# Patient Record
Sex: Female | Born: 1995 | Race: Black or African American | Hispanic: No | Marital: Single | Smoking: Never smoker
Health system: Southern US, Community
[De-identification: ages and names within clinical notes are randomized; demographics above are authoritative.]

## PROBLEM LIST (undated history)

## (undated) DIAGNOSIS — E611 Iron deficiency: Secondary | ICD-10-CM

## (undated) DIAGNOSIS — Z789 Other specified health status: Secondary | ICD-10-CM

## (undated) HISTORY — DX: Iron deficiency: E61.1

## (undated) HISTORY — PX: WISDOM TOOTH EXTRACTION: SHX21

---
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
## 2016-06-08 ENCOUNTER — Emergency Department (HOSPITAL_COMMUNITY)
Admission: EM | Admit: 2016-06-08 | Discharge: 2016-06-08 | Disposition: A | Discharge status: Home / Self Care | Payer: Self-pay | Attending: Emergency Medicine | Admitting: Emergency Medicine

## 2016-06-08 ENCOUNTER — Encounter (HOSPITAL_COMMUNITY): Payer: Self-pay | Admitting: Emergency Medicine

## 2016-06-08 DIAGNOSIS — L0231 Cutaneous abscess of buttock: Secondary | ICD-10-CM | POA: Insufficient documentation

## 2016-06-08 DIAGNOSIS — Z79899 Other long term (current) drug therapy: Secondary | ICD-10-CM | POA: Insufficient documentation

## 2016-06-08 LAB — POC URINE PREG, ED: Preg Test, Ur: NEGATIVE

## 2016-06-08 MED ORDER — SULFAMETHOXAZOLE-TRIMETHOPRIM 800-160 MG PO TABS: 1.0000 | ORAL_TABLET | Freq: Two times a day (BID) | ORAL | 0 refills | Status: AC

## 2016-06-08 MED ORDER — LIDOCAINE-EPINEPHRINE (PF) 2 %-1:200000 IJ SOLN
10.0000 mL | Freq: Once | INTRAMUSCULAR | Status: AC
  Administered 2016-06-08: 10 mL
  Filled 2016-06-08: qty 20

## 2016-06-08 MED ORDER — IBUPROFEN 800 MG PO TABS: 800.0000 mg | ORAL_TABLET | Freq: Three times a day (TID) | ORAL | 0 refills | Status: DC

## 2016-06-08 MED ORDER — CEPHALEXIN 500 MG PO CAPS
500.0000 mg | ORAL_CAPSULE | Freq: Four times a day (QID) | ORAL | 0 refills | Status: DC
Start: 2016-06-08 — End: 2019-02-15

## 2016-06-08 NOTE — ED Triage Notes (Signed)
Per pt, states boil on right buttock-noticed 2 days ago-states she has been applying warm compresses but no relief

## 2016-06-08 NOTE — Discharge Instructions (Signed)
Medications: Bactrim, Keflex  Treatment: Take Bactrim and Keflex until completed. Take ibuprofen every 8 hours as needed for pain. You can alternate with Tylenol as prescribed over-the-counter. Wash wound with warm soapy water tomorrow. Continue this daily and apply clean gauze dressing.  Follow-up: Please return in 2 days for wound recheck. Please return sooner if you develop and new or worsening symptoms including fever, increased pain, redness, swelling, or streaking from the area.

## 2016-06-08 NOTE — ED Provider Notes (Signed)
[facility]-EMERGENCY DEPT Provider Note   CSN: [id number] Arrival date & time: 06/08/16  1711  By signing my name below, I, Modena Jansky, attest that this documentation has been prepared under the direction and in the presence of non-physician practitioner, Glenford Bayley, PA-C. Electronically Signed: Modena Jansky, Scribe. 06/08/2016. 7:17 PM.  History   Chief Complaint Chief Complaint  Patient presents with  . Abscess   The history is provided by the patient. No language interpreter was used.   HPI Comments: Kathryn Brandt is a 21 y.o. female who presents to the Emergency Department complaining of constant moderate right gluteal bump that started about 2 days ago. She noticed a bump near her genital area without change since initial onset. She has been applying a warm compress without relief. No medication PTA. She denies any drainage or vaginal discharge/bleeding. Patient states she shaved the area a few days prior with a eyebrow tremor.  History reviewed. No pertinent past medical history.  There are no active problems to display for this patient.   History reviewed. No pertinent surgical history.  OB History    No data available       Home Medications    Prior to Admission medications   Medication Sig Start Date End Date Taking? Authorizing Provider  cephALEXin (KEFLEX) 500 MG capsule Take 1 capsule (500 mg total) by mouth 4 (four) times daily. 06/08/16   Emi Holes, PA-C  ibuprofen (ADVIL,MOTRIN) 800 MG tablet Take 1 tablet (800 mg total) by mouth 3 (three) times daily. 06/08/16   Emi Holes, PA-C  sulfamethoxazole-trimethoprim (BACTRIM DS,SEPTRA DS) 800-160 MG tablet Take 1 tablet by mouth 2 (two) times daily. 06/08/16 06/15/16  Emi Holes, PA-C    Family History No family history on file.  Social History Social History  Substance Use Topics  . Smoking status: Never Smoker  . Smokeless tobacco: Never Used  . Alcohol use No     Allergies   Patient has no  allergy information on record.   Review of Systems Review of Systems  Constitutional: Negative for chills and fever.  HENT: Negative for facial swelling and sore throat.   Respiratory: Negative for shortness of breath.   Cardiovascular: Negative for chest pain.  Gastrointestinal: Negative for abdominal pain, nausea and vomiting.  Genitourinary: Negative for dysuria, vaginal bleeding and vaginal discharge.  Musculoskeletal: Negative for back pain.  Skin: Negative for rash and wound.       +bump (right buttock)  Neurological: Negative for headaches.  Psychiatric/Behavioral: The patient is not nervous/anxious.      Physical Exam Updated Vital Signs BP 133/69 (BP Location: Left Arm)   Pulse 95   Temp 99 F (37.2 C) (Oral)   Resp 16   Ht 5\' 4"  (1.626 m)   Wt 183 lb 4.8 oz (83.1 kg)   LMP 05/18/2016   SpO2 98%   BMI 31.46 kg/m   Physical Exam  Constitutional: She appears well-developed and well-nourished. No distress.  HENT:  Head: Normocephalic and atraumatic.  Mouth/Throat: Oropharynx is clear and moist. No oropharyngeal exudate.  Eyes: Conjunctivae are normal. Pupils are equal, round, and reactive to light. Right eye exhibits no discharge. Left eye exhibits no discharge. No scleral icterus.  Neck: Normal range of motion. Neck supple. No thyromegaly present.  Cardiovascular: Normal rate, regular rhythm, normal heart sounds and intact distal pulses.  Exam reveals no gallop and no friction rub.   No murmur heard. Pulmonary/Chest: Effort normal and breath sounds normal. No  stridor. No respiratory distress. She has no wheezes. She has no rales.  Abdominal: Soft. Bowel sounds are normal. She exhibits no distension. There is no tenderness. There is no rebound and no guarding.  Genitourinary:     Musculoskeletal: She exhibits no edema.  Lymphadenopathy:    She has no cervical adenopathy.  Neurological: She is alert. Coordination normal.  Skin: Skin is warm and dry. No rash  noted. She is not diaphoretic. No pallor.  Psychiatric: She has a normal mood and affect.  Nursing note and vitals reviewed.    ED Treatments / Results  DIAGNOSTIC STUDIES: Oxygen Saturation is 98% on RA, normal by my interpretation.    COORDINATION OF CARE: 7:21 PM- Pt advised of plan for treatment and pt agrees.  Labs (all labs ordered are listed, but only abnormal results are displayed) Labs Reviewed  POC URINE PREG, ED    EKG  EKG Interpretation None       Radiology No results found.  Procedures Procedures (including critical care time)  EMERGENCY DEPARTMENT US SOFT TISSUE INTERPRETATION "Study: Limited Soft Tissue Ultrasound"  INDICATIONS: Soft tissue infection Multiple views of the body part were obtained in real-time with a multi-frequency linear probe  PERFORMED BY: Myself IMAGES ARCHIVED?: Yes SIDE:Right  BODY PART:R gluteal region INTERPRETATION:  Abcess present and Cellulitis present  INCISION AND DRAINAGE Performed by: Emi HolesAlexandra M Dimitra Woodstock Consent: Verbal consent obtained. Risks and benefits: risks, benefits and alternatives were discussed Type: abscess  Body area: R gluteal  Anesthesia: local infiltration  Incision was made with a scalpel.  Local anesthetic: lidocaine 2% w/ epinephrine  Anesthetic total: 5 ml  Complexity: complex Blunt dissection to break up loculations  Drainage: purulent  Drainage amount: moderate  Packing material: 1/4 in iodoform gauze  Patient tolerance: Patient tolerated the procedure well with no immediate complications.     Medications Ordered in ED Medications  lidocaine-EPINEPHrine (XYLOCAINE W/EPI) 2 %-1:200000 (PF) injection 10 mL (10 mLs Infiltration Given by Other 06/08/16 2100)     Initial Impression / Assessment and Plan / ED Course  I have reviewed the triage vital signs and the nursing notes.  Pertinent labs & imaging results that were available during my care of the patient were reviewed by me  and considered in my medical decision making (see chart for details).     Patient with skin abscess. Incision and drainage performed in the ED today. Wound packed. Wound recheck in 2 days. Supportive care and return precautions discussed.  Pt sent home with Bactrim, Keflex, ibuprofen. The patient appears reasonably screened and/or stabilized for discharge and I doubt any other emergent medical condition requiring further screening, evaluation, or treatment in the ED prior to discharge.    Final Clinical Impressions(s) / ED Diagnoses   Final diagnoses:  Abscess of buttock, right    New Prescriptions Discharge Medication List as of 06/08/2016 10:03 PM    START taking these medications   Details  cephALEXin (KEFLEX) 500 MG capsule Take 1 capsule (500 mg total) by mouth 4 (four) times daily., Starting Thu 06/08/2016, Print    ibuprofen (ADVIL,MOTRIN) 800 MG tablet Take 1 tablet (800 mg total) by mouth 3 (three) times daily., Starting Thu 06/08/2016, Print    sulfamethoxazole-trimethoprim (BACTRIM DS,SEPTRA DS) 800-160 MG tablet Take 1 tablet by mouth 2 (two) times daily., Starting Thu 06/08/2016, Until Thu 06/15/2016, Print       I personally performed the services described in this documentation, which was scribed in my presence. The recorded  information has been reviewed and is accurate.     Emi Holes, PA-C 06/12/16 0112    Doug Sou, MD 06/12/16 1216

## 2016-06-11 ENCOUNTER — Emergency Department (HOSPITAL_COMMUNITY)
Admission: EM | Admit: 2016-06-11 | Discharge: 2016-06-11 | Disposition: A | Discharge status: Home / Self Care | Payer: Self-pay | Attending: Emergency Medicine | Admitting: Emergency Medicine

## 2016-06-11 ENCOUNTER — Encounter (HOSPITAL_COMMUNITY): Payer: Self-pay | Admitting: Emergency Medicine

## 2016-06-11 DIAGNOSIS — L0291 Cutaneous abscess, unspecified: Secondary | ICD-10-CM

## 2016-06-11 DIAGNOSIS — L0231 Cutaneous abscess of buttock: Secondary | ICD-10-CM | POA: Insufficient documentation

## 2016-06-11 DIAGNOSIS — Z79899 Other long term (current) drug therapy: Secondary | ICD-10-CM | POA: Insufficient documentation

## 2016-06-11 NOTE — ED Notes (Signed)
Bed: WTR6 Expected date:  Expected time:  Means of arrival:  Comments: 

## 2016-06-11 NOTE — ED Provider Notes (Signed)
[facility]-EMERGENCY DEPT Provider Note   CSN: [id number] Arrival date & time: 06/11/16  [id number]  By signing my name below, I, Kathryn Brandt, attest that this documentation has been prepared under the direction and in the presence of Newell RubbermaidJeffrey Klara Stjames, PA-C.  Electronically Signed: Octavia HeirArianna Brandt, ED Scribe. 06/11/16. 11:34 AM.    History   Chief Complaint Chief Complaint  Patient presents with  . Abscess    Recheck   The history is provided by the patient. No language interpreter was used.   HPI Comments: Kathryn Brandt is a 21 y.o. female who presents to the Emergency Department presenting for a wound check. Pt had an I&D performed on an abscess to her right buttocks on 06/08/16. She was prescribed bactrim, keflex, and ibuprofen, which she has been compliant with taking. She notes the area "still feels tight' but the pain is gradually improving. She denies fever, discharge in stool, warmth or erythema to the area.   History reviewed. No pertinent past medical history.  There are no active problems to display for this patient.   History reviewed. No pertinent surgical history.  OB History    No data available       Home Medications    Prior to Admission medications   Medication Sig Start Date End Date Taking? Authorizing Provider  cephALEXin (KEFLEX) 500 MG capsule Take 1 capsule (500 mg total) by mouth 4 (four) times daily. 06/08/16   Emi HolesAlexandra M Law, PA-C  ibuprofen (ADVIL,MOTRIN) 800 MG tablet Take 1 tablet (800 mg total) by mouth 3 (three) times daily. 06/08/16   Emi HolesAlexandra M Law, PA-C  sulfamethoxazole-trimethoprim (BACTRIM DS,SEPTRA DS) 800-160 MG tablet Take 1 tablet by mouth 2 (two) times daily. 06/08/16 06/15/16  Emi HolesAlexandra M Law, PA-C    Family History No family history on file.  Social History Social History  Substance Use Topics  . Smoking status: Never Smoker  . Smokeless tobacco: Never Used  . Alcohol use No     Allergies   Patient has no allergy information on  record.   Review of Systems Review of Systems  A complete 10 system review of systems was obtained and all systems are negative except as noted in the HPI and PMH.   Physical Exam Updated Vital Signs BP (!) 119/52 (BP Location: Left Arm)   Pulse 69   Temp 98.7 F (37.1 C) (Oral)   Resp 18   Ht 5\' 4"  (1.626 m)   Wt 83 kg   LMP 05/18/2016   SpO2 98%   BMI 31.41 kg/m   Physical Exam  Constitutional: She is oriented to person, place, and time. She appears well-developed and well-nourished.  HENT:  Head: Normocephalic.  Eyes: EOM are normal.  Neck: Normal range of motion.  Pulmonary/Chest: Effort normal.  Abdominal: She exhibits no distension.  Musculoskeletal: Normal range of motion.  incision over right buttocks healing well, packing in place, no signs of significant discharge, no surrounding tenderness, no discharge noted.  Neurological: She is alert and oriented to person, place, and time.  Psychiatric: She has a normal mood and affect.  Nursing note and vitals reviewed.    ED Treatments / Results  DIAGNOSTIC STUDIES: Oxygen Saturation is 99% on RA, normal by my interpretation.  COORDINATION OF CARE:  10:20 AM Discussed treatment plan  with pt at bedside and pt agreed to plan.  Labs (all labs ordered are listed, but only abnormal results are displayed) Labs Reviewed - No data to display  EKG  EKG  Interpretation None       Radiology No results found.  Procedures Procedures (including critical care time)  Medications Ordered in ED Medications - No data to display   Initial Impression / Assessment and Plan / ED Course  I have reviewed the triage vital signs and the nursing notes.  Pertinent labs & imaging results that were available during my care of the patient were reviewed by me and considered in my medical decision making (see chart for details).      Final Clinical Impressions(s) / ED Diagnoses   Final diagnoses:  Abscess    Labs:  Imaging:  Consults:  Therapeutics:  Discharge Meds:   Assessment/Plan:Patient presents for wound recheck. Healing well with no signs of complication. Continue wound care instructions given, return precautions given.    I personally performed the services described in this documentation, which was scribed in my presence. The recorded information has been reviewed and is accurate.  New Prescriptions Discharge Medication List as of 06/11/2016 10:44 AM       Eyvonne Mechanic, PA-C 06/11/16 1134    Maia Plan, MD 06/11/16 [phone]

## 2016-06-11 NOTE — ED Triage Notes (Signed)
Patient reports abscess to buttock area. Patient had the area drained and was told to come for a re-check. Patient has no new complaints at this time.

## 2016-06-11 NOTE — Discharge Instructions (Signed)
Please read attached information. If you experience any new or worsening signs or symptoms please return to the emergency room for evaluation. Please follow-up with your primary care provider or specialist as discussed. Please use medication prescribed only as directed and discontinue taking if you have any concerning signs or symptoms.   °

## 2018-05-01 NOTE — L&D Delivery Note (Signed)
Delivery Note Patient pushed for approximately 45 minutes after she was noted to be C/C/+2.  At 6:17 PM a viable and healthy female was delivered via Vaginal, Spontaneous (Presentation:LOA restituted to ROA).  APGAR: 8, 9; weight pending.  Shoulders and body easily delivered. Infant laid on maternal abdomen.  Delayed cord clamping done and cord cut by Grandmother.  Cord blood obtained.  Placenta spontaneously delivered intact, 3 vessels noted.   Uterine atony alleviated by massage and IV pitocin.  Second degree vaginal /perineal laceration repaired in routine fashion with 2-0 vicryl and 3-0 chromic.  Hemostasis good.  Patient tolerated delivery well. There were no complications.    Anesthesia:  Epidural Episiotomy: None Lacerations: 2nd degree;Vaginal Suture Repair: 2.0 vicryl & 3-0 Chromic Est. Blood Loss (mL):  200  Mom to postpartum.  Baby to Couplet care / Skin to Skin.  Jaylenn Altier, [place] 02/13/2019, 6:41 PM

## 2018-10-01 DIAGNOSIS — Z363 Encounter for antenatal screening for malformations: Secondary | ICD-10-CM | POA: Diagnosis not present

## 2018-10-01 DIAGNOSIS — Z3A20 20 weeks gestation of pregnancy: Secondary | ICD-10-CM | POA: Diagnosis not present

## 2019-01-29 DIAGNOSIS — Z3A37 37 weeks gestation of pregnancy: Secondary | ICD-10-CM | POA: Diagnosis not present

## 2019-01-29 DIAGNOSIS — O358XX9 Maternal care for other (suspected) fetal abnormality and damage, other fetus: Secondary | ICD-10-CM | POA: Diagnosis not present

## 2019-02-13 ENCOUNTER — Inpatient Hospital Stay (HOSPITAL_COMMUNITY): Payer: Commercial Managed Care - PPO | Admitting: Anesthesiology

## 2019-02-13 ENCOUNTER — Other Ambulatory Visit: Payer: Self-pay

## 2019-02-13 ENCOUNTER — Encounter (HOSPITAL_COMMUNITY): Payer: Self-pay | Admitting: *Deleted

## 2019-02-13 ENCOUNTER — Inpatient Hospital Stay (HOSPITAL_COMMUNITY)
Admission: AD | Admit: 2019-02-13 | Discharge: 2019-02-15 | DRG: 807 | Disposition: A | Discharge status: Home / Self Care | Payer: Commercial Managed Care - PPO | Attending: Obstetrics & Gynecology | Admitting: Obstetrics & Gynecology

## 2019-02-13 DIAGNOSIS — Z20828 Contact with and (suspected) exposure to other viral communicable diseases: Secondary | ICD-10-CM | POA: Diagnosis present

## 2019-02-13 DIAGNOSIS — O4292 Full-term premature rupture of membranes, unspecified as to length of time between rupture and onset of labor: Secondary | ICD-10-CM | POA: Diagnosis present

## 2019-02-13 DIAGNOSIS — Z3689 Encounter for other specified antenatal screening: Secondary | ICD-10-CM

## 2019-02-13 DIAGNOSIS — O9081 Anemia of the puerperium: Secondary | ICD-10-CM | POA: Diagnosis not present

## 2019-02-13 DIAGNOSIS — Z3A39 39 weeks gestation of pregnancy: Secondary | ICD-10-CM | POA: Diagnosis not present

## 2019-02-13 DIAGNOSIS — O134 Gestational [pregnancy-induced] hypertension without significant proteinuria, complicating childbirth: Secondary | ICD-10-CM | POA: Diagnosis present

## 2019-02-13 DIAGNOSIS — O139 Gestational [pregnancy-induced] hypertension without significant proteinuria, unspecified trimester: Secondary | ICD-10-CM | POA: Diagnosis not present

## 2019-02-13 LAB — SARS CORONAVIRUS 2 BY RT PCR (HOSPITAL ORDER, PERFORMED IN ~~LOC~~ HOSPITAL LAB): SARS Coronavirus 2: NEGATIVE

## 2019-02-13 LAB — CBC
HCT: 32.3 % — ABNORMAL LOW (ref 36.0–46.0)
HCT: 29.3 % — ABNORMAL LOW (ref 36.0–46.0)
Hemoglobin: 10.3 g/dL — ABNORMAL LOW (ref 12.0–15.0)
Hemoglobin: 9.6 g/dL — ABNORMAL LOW (ref 12.0–15.0)
MCH: 25.2 pg — ABNORMAL LOW (ref 26.0–34.0)
MCH: 25.3 pg — ABNORMAL LOW (ref 26.0–34.0)
MCHC: 31.9 g/dL (ref 30.0–36.0)
MCHC: 32.8 g/dL (ref 30.0–36.0)
MCV: 79.2 fL — ABNORMAL LOW (ref 80.0–100.0)
MCV: 77.3 fL — ABNORMAL LOW (ref 80.0–100.0)
Platelets: 237 10*3/uL (ref 150–400)
Platelets: 215 10*3/uL (ref 150–400)
RBC: 4.08 MIL/uL (ref 3.87–5.11)
RBC: 3.79 MIL/uL — ABNORMAL LOW (ref 3.87–5.11)
RDW: 14.7 % (ref 11.5–15.5)
RDW: 14.8 % (ref 11.5–15.5)
WBC: 10.5 10*3/uL (ref 4.0–10.5)
WBC: 17.9 10*3/uL — ABNORMAL HIGH (ref 4.0–10.5)
nRBC: 0 % (ref 0.0–0.2)
nRBC: 0 % (ref 0.0–0.2)

## 2019-02-13 LAB — COMPREHENSIVE METABOLIC PANEL
ALT: 13 U/L (ref 0–44)
AST: 21 U/L (ref 15–41)
Albumin: 2.8 g/dL — ABNORMAL LOW (ref 3.5–5.0)
Alkaline Phosphatase: 137 U/L — ABNORMAL HIGH (ref 38–126)
Anion gap: 12 (ref 5–15)
BUN: 6 mg/dL (ref 6–20)
CO2: 20 mmol/L — ABNORMAL LOW (ref 22–32)
Calcium: 8.8 mg/dL — ABNORMAL LOW (ref 8.9–10.3)
Chloride: 104 mmol/L (ref 98–111)
Creatinine, Ser: 0.51 mg/dL (ref 0.44–1.00)
GFR calc Af Amer: >60 mL/min (ref >60)
GFR calc non Af Amer: >60 mL/min (ref >60)
Glucose, Bld: 72 mg/dL (ref 70–99)
Potassium: 3.4 mmol/L — ABNORMAL LOW (ref 3.5–5.1)
Sodium: 136 mmol/L (ref 135–145)
Total Bilirubin: 0.8 mg/dL (ref 0.3–1.2)
Total Protein: 6.5 g/dL (ref 6.5–8.1)

## 2019-02-13 LAB — PROTEIN / CREATININE RATIO, URINE
Creatinine, Urine: 172.77 mg/dL
Protein Creatinine Ratio: 0.09 mg/mg{Cre} (ref 0.00–0.15)
Total Protein, Urine: 16 mg/dL

## 2019-02-13 LAB — TYPE AND SCREEN
ABO/RH(D): A POS
Antibody Screen: NEGATIVE

## 2019-02-13 LAB — ABO/RH: ABO/RH(D): A POS

## 2019-02-13 LAB — RPR: RPR Ser Ql: NONREACTIVE

## 2019-02-13 MED ORDER — ACETAMINOPHEN 325 MG PO TABS: 650.0000 mg | ORAL_TABLET | ORAL | Status: DC | PRN

## 2019-02-13 MED ORDER — OXYTOCIN 40 UNITS IN NORMAL SALINE INFUSION - SIMPLE MED
1.0000 m[IU]/min | INTRAVENOUS | Status: DC
  Administered 2019-02-13: 2 m[IU]/min via INTRAVENOUS

## 2019-02-13 MED ORDER — LIDOCAINE HCL (PF) 1 % IJ SOLN
INTRAMUSCULAR | Status: DC | PRN
  Administered 2019-02-13: 10 mL via EPIDURAL

## 2019-02-13 MED ORDER — LACTATED RINGERS IV SOLN
INTRAVENOUS | Status: DC
  Administered 2019-02-13 (×2): via INTRAVENOUS

## 2019-02-13 MED ORDER — LACTATED RINGERS IV SOLN: 500.0000 mL | INTRAVENOUS | Status: DC | PRN

## 2019-02-13 MED ORDER — IBUPROFEN 600 MG PO TABS
600.0000 mg | ORAL_TABLET | Freq: Four times a day (QID) | ORAL | Status: DC
  Administered 2019-02-13 – 2019-02-15 (×7): 600 mg via ORAL
  Filled 2019-02-13 (×7): qty 1

## 2019-02-13 MED ORDER — SOD CITRATE-CITRIC ACID 500-334 MG/5ML PO SOLN: 30.0000 mL | ORAL | Status: DC | PRN

## 2019-02-13 MED ORDER — DIPHENHYDRAMINE HCL 25 MG PO CAPS: 25.0000 mg | ORAL_CAPSULE | Freq: Four times a day (QID) | ORAL | Status: DC | PRN

## 2019-02-13 MED ORDER — OXYTOCIN BOLUS FROM INFUSION
500.0000 mL | Freq: Once | INTRAVENOUS | Status: AC
  Administered 2019-02-13: 18:00:00 500 mL via INTRAVENOUS

## 2019-02-13 MED ORDER — LIDOCAINE HCL (PF) 1 % IJ SOLN: 30.0000 mL | INTRAMUSCULAR | Status: DC | PRN

## 2019-02-13 MED ORDER — ONDANSETRON HCL 4 MG/2ML IJ SOLN: 4.0000 mg | Freq: Four times a day (QID) | INTRAMUSCULAR | Status: DC | PRN

## 2019-02-13 MED ORDER — PHENYLEPHRINE 40 MCG/ML (10ML) SYRINGE FOR IV PUSH (FOR BLOOD PRESSURE SUPPORT): 80.0000 ug | PREFILLED_SYRINGE | INTRAVENOUS | Status: DC | PRN

## 2019-02-13 MED ORDER — TERBUTALINE SULFATE 1 MG/ML IJ SOLN: 0.2500 mg | Freq: Once | INTRAMUSCULAR | Status: DC | PRN

## 2019-02-13 MED ORDER — ACETAMINOPHEN 325 MG PO TABS
650.0000 mg | ORAL_TABLET | ORAL | Status: DC | PRN
  Administered 2019-02-13: 650 mg via ORAL
  Filled 2019-02-13: qty 2

## 2019-02-13 MED ORDER — ONDANSETRON HCL 4 MG PO TABS: 4.0000 mg | ORAL_TABLET | ORAL | Status: DC | PRN

## 2019-02-13 MED ORDER — EPHEDRINE 5 MG/ML INJ: 10.0000 mg | INTRAVENOUS | Status: DC | PRN

## 2019-02-13 MED ORDER — FERROUS SULFATE 325 (65 FE) MG PO TABS
325.0000 mg | ORAL_TABLET | Freq: Two times a day (BID) | ORAL | Status: DC
  Administered 2019-02-14 – 2019-02-15 (×3): 325 mg via ORAL
  Filled 2019-02-13 (×3): qty 1

## 2019-02-13 MED ORDER — OXYTOCIN 40 UNITS IN NORMAL SALINE INFUSION - SIMPLE MED
2.5000 [IU]/h | INTRAVENOUS | Status: DC
  Filled 2019-02-13: qty 1000

## 2019-02-13 MED ORDER — TETANUS-DIPHTH-ACELL PERTUSSIS 5-2.5-18.5 LF-MCG/0.5 IM SUSP: 0.5000 mL | Freq: Once | INTRAMUSCULAR | Status: DC

## 2019-02-13 MED ORDER — FENTANYL-BUPIVACAINE-NACL 0.5-0.125-0.9 MG/250ML-% EP SOLN
EPIDURAL | Status: AC
  Filled 2019-02-13: qty 250

## 2019-02-13 MED ORDER — SIMETHICONE 80 MG PO CHEW: 80.0000 mg | CHEWABLE_TABLET | ORAL | Status: DC | PRN

## 2019-02-13 MED ORDER — SODIUM CHLORIDE (PF) 0.9 % IJ SOLN
INTRAMUSCULAR | Status: DC | PRN
  Administered 2019-02-13: 12 mL/h via EPIDURAL

## 2019-02-13 MED ORDER — COCONUT OIL OIL: 1.0000 "application " | TOPICAL_OIL | Status: DC | PRN

## 2019-02-13 MED ORDER — SENNOSIDES-DOCUSATE SODIUM 8.6-50 MG PO TABS
2.0000 | ORAL_TABLET | ORAL | Status: DC
  Administered 2019-02-13 – 2019-02-14 (×2): 2 via ORAL
  Filled 2019-02-13 (×2): qty 2

## 2019-02-13 MED ORDER — DIPHENHYDRAMINE HCL 50 MG/ML IJ SOLN: 12.5000 mg | INTRAMUSCULAR | Status: DC | PRN

## 2019-02-13 MED ORDER — BENZOCAINE-MENTHOL 20-0.5 % EX AERO
1.0000 "application " | INHALATION_SPRAY | CUTANEOUS | Status: DC | PRN
  Filled 2019-02-13 (×2): qty 56

## 2019-02-13 MED ORDER — PRENATAL MULTIVITAMIN CH
1.0000 | ORAL_TABLET | Freq: Every day | ORAL | Status: DC
  Administered 2019-02-14 – 2019-02-15 (×2): 1 via ORAL
  Filled 2019-02-13 (×2): qty 1

## 2019-02-13 MED ORDER — OXYCODONE-ACETAMINOPHEN 5-325 MG PO TABS: 1.0000 | ORAL_TABLET | ORAL | Status: DC | PRN

## 2019-02-13 MED ORDER — DIBUCAINE (PERIANAL) 1 % EX OINT: 1.0000 "application " | TOPICAL_OINTMENT | CUTANEOUS | Status: DC | PRN

## 2019-02-13 MED ORDER — LACTATED RINGERS IV SOLN: 500.0000 mL | Freq: Once | INTRAVENOUS | Status: DC

## 2019-02-13 MED ORDER — ZOLPIDEM TARTRATE 5 MG PO TABS: 5.0000 mg | ORAL_TABLET | Freq: Every evening | ORAL | Status: DC | PRN

## 2019-02-13 MED ORDER — ONDANSETRON HCL 4 MG/2ML IJ SOLN: 4.0000 mg | INTRAMUSCULAR | Status: DC | PRN

## 2019-02-13 MED ORDER — WITCH HAZEL-GLYCERIN EX PADS: 1.0000 "application " | MEDICATED_PAD | CUTANEOUS | Status: DC | PRN

## 2019-02-13 MED ORDER — OXYCODONE-ACETAMINOPHEN 5-325 MG PO TABS: 2.0000 | ORAL_TABLET | ORAL | Status: DC | PRN

## 2019-02-13 MED ORDER — FENTANYL-BUPIVACAINE-NACL 0.5-0.125-0.9 MG/250ML-% EP SOLN: 12.0000 mL/h | EPIDURAL | Status: DC | PRN

## 2019-02-13 NOTE — MAU Note (Signed)
Pt reports to MAU stating around 0430 she attempted to go to the bathroom and before she could even get the the BR she has a large gush of fluids followed by vaginal bleeding. Pt states it is almost like she is on her period. Pt reports she put on a pad. Pt reports +FM. Pt reports abdominal cramping that is a 4/10.

## 2019-02-13 NOTE — H&P (Signed)
Kathryn Brandt is a 23 y.o. female G1P0 at 66 weeks 4 days admitted for PROM at 04:30 am. She is having regular contraction pain, normal fetal movement.   Her prenatal care was uncomplicated.    OB History    Gravida  1   Para      Term      Preterm      AB      Living        SAB      TAB      Ectopic      Multiple      Live Births             Past Medical History:  Diagnosis Date  . Medical history non-contributory    Past Surgical History:  Procedure Laterality Date  . WISDOM TOOTH EXTRACTION     Family History: family history includes Diabetes in her father, maternal grandfather, and paternal grandmother. Social History:  reports that she has never smoked. She has never used smokeless tobacco. She reports that she does not drink alcohol or use drugs.     Maternal Diabetes: No Genetic Screening: Normal Maternal Ultrasounds/Referrals: Normal Fetal Ultrasounds or other Referrals:  None Maternal Substance Abuse:  No Significant Maternal Medications:  None Significant Maternal Lab Results:  Group B Strep negative Other Comments:  None  Review of Systems  Reason unable to perform ROS: as per HPI.  Constitutional: Negative.   Eyes: Negative.   Gastrointestinal: Negative.   All other systems reviewed and are negative.  Maternal Medical History:  Reason for admission: Rupture of membranes.   Contractions: Onset was 6-12 hours ago.   Frequency: regular.   Perceived severity is moderate.    Fetal activity: Perceived fetal activity is normal.   Last perceived fetal movement was within the past 12 hours.    Prenatal complications: no prenatal complications Prenatal Complications - Diabetes: none.    Dilation: 4 Effacement (%): 80 Station: 0 Exam by:: Dr. Alwyn Pea Blood pressure 137/79, pulse 91, temperature 98.5 F (36.9 C), temperature source Oral, resp. rate 18, height 5\' 4"  (1.626 m), weight 102.1 kg.  Forebag ruptured clear fluid  Maternal  Exam:  Uterine Assessment: Contraction strength is moderate.  Contraction frequency is regular.   Abdomen: Patient reports no abdominal tenderness. Fundal height is 39 cm.   Estimated fetal weight is 3000 grams.   Fetal presentation: vertex  Introitus: Normal vulva. Normal vagina.  Ferning test: positive.  Nitrazine test: not done. Amniotic fluid character: clear.  Pelvis: adequate for delivery.        Physical Exam  Genitourinary:    Vulva normal.     Prenatal labs: ABO, Rh: --/--/A POS, A POS Performed at [facility], [place] [address] [place], [place] [address]  [phone]) Antibody: NEG (10/15 0725) Rubella:  Immune RPR: NON REACTIVE (10/15 0814)  HBsAg:   Neg HIV:   Neg GBS:   Neg  Assessment/Plan: 23 year old G1P0 at 39 weeks 4 days PROM in early labor On Admission CNM checked patient and she was 1cm she has progressed now to 4cm Pitocin for augmentation.  Patient now desires an epidural. Continue close monitoring Fetal heart tracing reassuring.    Kathryn Brandt, [place] 02/13/2019, 12:51 PM

## 2019-02-13 NOTE — Anesthesia Preprocedure Evaluation (Addendum)

## 2019-02-13 NOTE — Lactation Note (Signed)
This note was copied from a baby's chart. Lactation Consultation Note Baby 4 hrs old. Has no interest in BF. Baby is grunting laying on mom's chest STS. Praised mom for doing STS. Baby had some mucous in mouth, occasionally blow a bubble from his mouth. LC demonstrated using bulb syring in corner of his mouth.  Mom has heavy breast, flat nipples. LC demonstrated breast massage and hand expression. After several minutes of hand expression and massage collected few drops of thick colostrum. Gave mom hand pump for pre-pumping before latching, and post pumping if needed to soften breast. No colostrum noted w/pumping. Encouraged hand expression after pumping as well. Encouraged finger stimulation to nipple to harden and evert nipple before latching. Shells given and strongly encouraged to wear to evert nipples. If mom doesn't wear shells or pre-pump nipples, she may have to end up using NS.  Newborn behavior, feeding habits, STS, I&O, breast massage, milk storage, supply and demand discussed. Mom encouraged to feed baby 8-12 times/24 hours and with feeding cues. Mom encouraged to waken baby for feeds if hasn't cued in 3 hrs. Mom had several pacifiers at bedside. LC discouraged for 2 weeks.   Encouraged mom to drink plenty fluids and rest when she can. Alert RN if baby cont. To grunt or changes. Encouraged to call for assistance when baby is cueing for feeding. Lactation brochure given.  Patient Name: Boy Jemya Depierro IOEVO'J Date: 02/13/2019 Reason for consult: Initial assessment;Term;Primapara   Maternal Data Has patient been taught Hand Expression?: Yes Does the patient have breastfeeding experience prior to this delivery?: No  Feeding Feeding Type: Breast Fed  LATCH Score Latch: Too sleepy or reluctant, no latch achieved, no sucking elicited.  Audible Swallowing: None  Type of Nipple: Flat  Comfort (Breast/Nipple): Filling, red/small blisters or bruises, mild/mod  discomfort(breast heavy)        Interventions Interventions: Breast feeding basics reviewed;Assisted with latch;Breast compression;Shells;Skin to skin;Breast massage;Support pillows;Hand pump;Hand express;Pre-pump if needed;Expressed milk;Position options  Lactation Tools Discussed/Used Tools: Shells;Pump Shell Type: Inverted Breast pump type: Manual WIC Program: Yes Pump Review: Setup, frequency, and cleaning;Milk Storage Initiated by:: Allayne Stack RN IBCLC Date initiated:: 02/13/19   Consult Status Consult Status: Follow-up Date: 02/14/19 Follow-up type: In-patient    Theodoro Kalata 02/13/2019, 11:16 PM

## 2019-02-13 NOTE — MAU Provider Note (Signed)
Pt informed that the ultrasound is considered a limited OB ultrasound and is not intended to be a complete ultrasound exam.  Patient also informed that the ultrasound is not being completed with the intent of assessing for fetal or placental anomalies or any pelvic abnormalities.  Explained that the purpose of today's ultrasound is to assess for  presentation.  Patient acknowledges the purpose of the exam and the limitations of the study.    Vertex presentation confirmed prior to patient being admitted to L&D  Lajean Manes, CNM 02/13/19, 7:00 AM

## 2019-02-13 NOTE — Anesthesia Procedure Notes (Signed)
Epidural Patient location during procedure: OB Start time: 02/13/2019 12:57 PM End time: 02/13/2019 1:10 PM  Staffing Anesthesiologist: Lidia Collum, MD Performed: anesthesiologist   Preanesthetic Checklist Completed: patient identified, pre-op evaluation, timeout performed, IV checked, risks and benefits discussed and monitors and equipment checked  Epidural Patient position: sitting Prep: DuraPrep Patient monitoring: heart rate, continuous pulse ox and blood pressure Approach: midline Location: L3-L4 Injection technique: LOR air  Needle:  Needle type: Tuohy  Needle gauge: 17 G Needle length: 9 cm Needle insertion depth: 7 cm Catheter type: closed end flexible Catheter size: 19 Gauge Catheter at skin depth: 12 cm Test dose: negative  Assessment Events: blood not aspirated, injection not painful, no injection resistance, negative IV test and no paresthesia  Additional Notes Reason for block:procedure for pain

## 2019-02-13 NOTE — Progress Notes (Signed)
Subjective: Postpartum Day # 1 : S/P NSVD due to PROM. Patient up ad lib, denies syncope or dizziness. Reports consuming regular diet without issues and denies N/V. Patient reports 0 bowel movement + passing flatus.  Denies issues with urination and reports bleeding is "lighter."  Patient is breastfeeding and reports going well.  Desires undecided for postpartum contraception.  Pain is being appropriately managed with use of po meds. Asymptomatic anemia with hgb drop from 10.3-8.6. Pt had elevated BP dutrin labor and met criteria for GHTN, dx was made, PCR was 0.09, all other labs  Unremarkable, denies HA< RUQ pain, no vision changes.   2nd laceration Feeding:  breast Contraceptive plan:  undecided BB: Circ out pt desired  Objective: Vital signs in last 24 hours: Patient Vitals for the past 24 hrs:  BP Temp Temp src Pulse Resp SpO2 Height Weight  02/14/19 0535 130/75 (!) 97.5 F (36.4 C) Oral 73 18 99 % - -  02/14/19 0120 117/76 98.2 F (36.8 C) Oral 76 18 98 % - -  02/13/19 2115 135/75 98.9 F (37.2 C) Oral 98 18 - - -  02/13/19 2016 (!) 145/86 98.7 F (37.1 C) Oral 92 20 - - -  02/13/19 2001 137/71 - - 86 - - - -  02/13/19 1930 (!) 143/72 - - 91 - - - -  02/13/19 1847 (!) 153/74 - - 100 18 - - -  02/13/19 1834 (!) 151/72 - - (!) 108 18 - - -  02/13/19 1823 (!) 141/61 - - (!) 108 18 - - -  02/13/19 1731 (!) 149/82 - - (!) 111 18 - - -  02/13/19 1701 (!) 150/82 - - (!) 110 18 - - -  02/13/19 1631 133/65 - - 94 - - - -  02/13/19 1601 136/70 - - 89 16 - - -  02/13/19 1501 (!) 149/85 - - 87 18 - - -  02/13/19 1433 (!) 142/93 - - 77 - - - -  02/13/19 1431 (!) 142/93 99.2 F (37.3 C) Oral 77 - - - -  02/13/19 1401 (!) 140/91 - - 76 18 - - -  02/13/19 1336 136/81 - - 81 - 100 % - -  02/13/19 1331 137/82 - - 92 18 100 % - -  02/13/19 1327 140/76 - - 75 - 99 % - -  02/13/19 1322 138/78 - - 81 18 98 % - -  02/13/19 1321 138/78 - - 81 - 98 % - -  02/13/19 1317 136/74 - - 83 18 99 % - -   02/13/19 1311 (!) 155/90 - - 97 18 99 % - -  02/13/19 1309 (!) 144/84 - - 82 - - - -  02/13/19 1307 (!) 144/84 - - 82 - 100 % - -  02/13/19 1129 137/79 - - 91 - - - -  02/13/19 1105 137/70 - - 76 18 - - -  02/13/19 1101 (!) 166/77 - - 84 - - - -  02/13/19 1041 130/68 98.5 F (36.9 C) Oral 92 18 - - -  02/13/19 0941 139/82 - - 91 18 - - -  02/13/19 0939 139/82 - - 91 - - - -  02/13/19 0805 131/70 98.2 F (36.8 C) Oral 91 18 - _0  (1.626 m) 102.1 kg  02/13/19 0730 136/75 - - 98 - - - -  02/13/19 0715 (!) 142/84 - - (!) 109 - - - -  02/13/19 0700 131/84 - - 93 - - - -  02/13/19 0649 (!) 144/79 - - 91 - - - -     Physical Exam:  General: alert, cooperative, appears stated age and no distress Mood/Affect: Happy Lungs: clear to auscultation, no wheezes, rales or rhonchi, symmetric air entry.  Heart: normal rate, regular rhythm, normal S1, S2, no murmurs, rubs, clicks or gallops. Breast: breasts appear normal, no suspicious masses, no skin or nipple changes or axillary nodes. Abdomen:  + bowel sounds, soft, non-tender GU: perineum approximate, healing well. No signs of external hematomas.  Uterine Fundus: firm Lochia: appropriate Skin: Warm, Dry. DVT Evaluation: No evidence of DVT seen on physical exam. Negative Homan's sign. No cords or calf tenderness. No significant calf/ankle edema.  CBC Latest Ref Rng & Units 02/14/2019 02/13/2019 02/13/2019  WBC 4.0 - 10.5 K/uL 15.4(H) 17.9(H) 10.5  Hemoglobin 12.0 - 15.0 g/dL 8.6(L) 9.6(L) 10.3(L)  Hematocrit 36.0 - 46.0 % 27.4(L) 29.3(L) 32.3(L)  Platelets 150 - 400 K/uL 205 215 237    Results for orders placed or performed during the hospital encounter of 02/13/19 (from the past 24 hour(s))  Type and screen [place]     Status: None   Collection Time: 02/13/19  7:25 AM  Result Value Ref Range   ABO/RH(D) A POS    Antibody Screen NEG    Sample Expiration      02/16/2019,2359 Performed at [facility], [place] [address] [place], [place] [address]   ABO/Rh     Status: None   Collection Time: 02/13/19  7:25 AM  Result Value Ref Range   ABO/RH(D)      A POS Performed at [facility] [address] [place], [place] [address]   SARS Coronavirus 2 by RT PCR (hospital order, performed in [facility] hospital lab) Nasopharyngeal Nasopharyngeal Swab     Status: None   Collection Time: 02/13/19  8:00 AM   Specimen: Nasopharyngeal Swab  Result Value Ref Range   SARS Coronavirus 2 NEGATIVE NEGATIVE  CBC     Status: Abnormal   Collection Time: 02/13/19  8:14 AM  Result Value Ref Range   WBC 10.5 4.0 - 10.5 K/uL   RBC 4.08 3.87 - 5.11 MIL/uL   Hemoglobin 10.3 (L) 12.0 - 15.0 g/dL   HCT 32.3 (L) 36.0 - 46.0 %   MCV 79.2 (L) 80.0 - 100.0 fL   MCH 25.2 (L) 26.0 - 34.0 pg   MCHC 31.9 30.0 - 36.0 g/dL   RDW 14.7 11.5 - 15.5 %   Platelets 237 150 - 400 K/uL   nRBC 0.0 0.0 - 0.2 %  RPR     Status: None   Collection Time: 02/13/19  8:14 AM  Result Value Ref Range   RPR Ser Ql NON REACTIVE NON REACTIVE  Comprehensive metabolic panel     Status: Abnormal   Collection Time: 02/13/19  8:14 AM  Result Value Ref Range   Sodium 136 135 - 145 mmol/L   Potassium 3.4 (L) 3.5 - 5.1 mmol/L   Chloride 104 98 - 111 mmol/L   CO2 20 (L) 22 - 32 mmol/L   Glucose, Bld 72 70 - 99 mg/dL   BUN 6 6 - 20 mg/dL   Creatinine, Ser 0.51 0.44 - 1.00 mg/dL   Calcium 8.8 (L) 8.9 - 10.3 mg/dL   Total Protein 6.5 6.5 - 8.1 g/dL   Albumin 2.8 (L) 3.5 - 5.0 g/dL   AST 21 15 - 41 U/L   ALT 13  0 - 44 U/L   Alkaline Phosphatase 137 (H) 38 - 126 U/L   Total Bilirubin 0.8 0.3 - 1.2 mg/dL   GFR calc non Af Amer >60 >60 mL/min   GFR calc Af Amer >60 >60 mL/min   Anion gap 12 5 - 15  Protein / creatinine ratio, urine     Status: None   Collection Time: 02/13/19  9:10 AM  Result Value Ref Range   Creatinine, Urine 172.77 mg/dL   Total Protein, Urine 16 mg/dL   Protein Creatinine Ratio 0.09 0.00 - 0.15 mg/mg[Cre]   CBC     Status: Abnormal   Collection Time: 02/13/19  7:28 PM  Result Value Ref Range   WBC 17.9 (H) 4.0 - 10.5 K/uL   RBC 3.79 (L) 3.87 - 5.11 MIL/uL   Hemoglobin 9.6 (L) 12.0 - 15.0 g/dL   HCT 29.3 (L) 36.0 - 46.0 %   MCV 77.3 (L) 80.0 - 100.0 fL   MCH 25.3 (L) 26.0 - 34.0 pg   MCHC 32.8 30.0 - 36.0 g/dL   RDW 14.8 11.5 - 15.5 %   Platelets 215 150 - 400 K/uL   nRBC 0.0 0.0 - 0.2 %  CBC     Status: Abnormal   Collection Time: 02/14/19  6:15 AM  Result Value Ref Range   WBC 15.4 (H) 4.0 - 10.5 K/uL   RBC 3.44 (L) 3.87 - 5.11 MIL/uL   Hemoglobin 8.6 (L) 12.0 - 15.0 g/dL   HCT 27.4 (L) 36.0 - 46.0 %   MCV 79.7 (L) 80.0 - 100.0 fL   MCH 25.0 (L) 26.0 - 34.0 pg   MCHC 31.4 30.0 - 36.0 g/dL   RDW 14.8 11.5 - 15.5 %   Platelets 205 150 - 400 K/uL   nRBC 0.0 0.0 - 0.2 %     CBG (last 3)  No results for input(s): GLUCAP in the last 72 hours.   I/O last 3 completed shifts: In: -  Out: 200 [Urine:200]   Assessment Postpartum Day # 1 : S/P NSVD due to PROM. Pt stable. -1 involution. breastfeeding. Hemodynamically stable with drop from 10.3-8.6, denies s/sx. Baby female for out pt circ. GHTN; 130/75, asymptomatic  Plan: Continue other mgmt as ordered Anemia: Iron  GHTN: Monitor BP, if continues to be elevated will start procardia 76m xl daily.  VTE prophylactics: Early ambulated as tolerates.  Pain control: Motrin/Tylenol PRN Education given regarding options for contraception, including barrier methods, injectable contraception, IUD placement, oral contraceptives.  Plan for discharge tomorrow and Breastfeeding   Dr. PAlwyn Peato be updated on patient status  [facility] HospitalNP-C, CNM 02/14/2019, 6:48 AM

## 2019-02-14 ENCOUNTER — Encounter (HOSPITAL_COMMUNITY): Payer: Self-pay

## 2019-02-14 LAB — CBC
HCT: 27.4 % — ABNORMAL LOW (ref 36.0–46.0)
Hemoglobin: 8.6 g/dL — ABNORMAL LOW (ref 12.0–15.0)
MCH: 25 pg — ABNORMAL LOW (ref 26.0–34.0)
MCHC: 31.4 g/dL (ref 30.0–36.0)
MCV: 79.7 fL — ABNORMAL LOW (ref 80.0–100.0)
Platelets: 205 10*3/uL (ref 150–400)
RBC: 3.44 MIL/uL — ABNORMAL LOW (ref 3.87–5.11)
RDW: 14.8 % (ref 11.5–15.5)
WBC: 15.4 10*3/uL — ABNORMAL HIGH (ref 4.0–10.5)
nRBC: 0 % (ref 0.0–0.2)

## 2019-02-14 NOTE — Anesthesia Postprocedure Evaluation (Signed)
Anesthesia Post Note  Patient: Kathryn Brandt  Procedure(s) Performed: AN AD HOC LABOR EPIDURAL     Patient location during evaluation: Mother Baby Anesthesia Type: Epidural Level of consciousness: awake and alert, oriented and patient cooperative Pain management: pain level controlled Vital Signs Assessment: post-procedure vital signs reviewed and stable Respiratory status: spontaneous breathing Cardiovascular status: stable Postop Assessment: no headache, epidural receding, patient able to bend at knees and no signs of nausea or vomiting Anesthetic complications: no Comments: Pt. Interviewed via phone consultation.  Pt.  States she is walking. Pain score 0.      Last Vitals:  Vitals:   02/14/19 0120 02/14/19 0535  BP: 117/76 130/75  Pulse: 76 73  Resp: 18 18  Temp: 36.8 C (!) 36.4 C  SpO2: 98% 99%    Last Pain:  Vitals:   02/14/19 0535  TempSrc: Oral  PainSc:    Pain Goal:                   [facility]

## 2019-02-14 NOTE — Lactation Note (Signed)
This note was copied from a baby's chart. Lactation Consultation Note  Patient Name: Boy Symantha Steeber [id number] Date: 02/14/2019 Reason for consult: Follow-up assessment   Mom and grandmother had questions about DEBP and feeding infant breastmilk.  Last feed was several hours ago.  Mom has been bottle feeding formula.  When [facility] inquired about moms goals in feeding her baby, mom prefers to give breastmilk if possible.  LC reviewed basics of milk production and stimulation of milk supply.  Grandmother encouraged mom to try to pump to get breastmilk to provided via bottle.   Mom states she would like to try to bf.  Hand expression done by mom, small amt. Glistening seen.  Infant sucked glove finger in order to get into a rhythm; he was previously tongue sucking when going to the breast.  After multiple attempts, infant achieved latch, mom sandwiched breast well, rhythmic sucking and continual feed for 20 minutes.  Mom and grandmother were very excited.    Infant self detached and mom placed in on other breast in football hold.  Solen demo. Had to use hand pump prior to latching.  Mom has flat/semi flat nipples with short shafts.  LC reviewed positioning, sandwiching breast, and bringing infant to the breast.  Mom latched infant on other side and infant began feeding.    Mom has lansinoh pump at home.  She desires to use our DEBP to obtain EBM to feed with bottle in addition to breastfeeding,  now that she sees he is capable.    LC reviewed basics of BF.   Encouraged mom to feed on demand, at least 8-12 times in 24 hours, pump after feeds to encouraged milk supply due to not putting infant to breast frequently in first 24 hours of life.  Gueydan asked mom to let her RN know if questions or concerns arise.  LC will request pump set to moms RN.     Maternal Data    Feeding Feeding Type: Breast Fed  LATCH Score Latch: Repeated attempts needed to sustain latch, nipple held in mouth throughout feeding,  stimulation needed to elicit sucking reflex.  Audible Swallowing: A few with stimulation  Type of Nipple: Flat(everts after hand pump/ hand expression)  Comfort (Breast/Nipple): Soft / non-tender  Hold (Positioning): Assistance needed to correctly position infant at breast and maintain latch.  LATCH Score: 6  Interventions Interventions: Breast feeding basics reviewed;Assisted with latch;Skin to skin;Breast massage;Hand express;Breast compression;Adjust position;Hand pump;Shells;Expressed milk;Position options;Support pillows  Lactation Tools Discussed/Used Tools: Shells;Pump Breast pump type: Manual   Consult Status Consult Status: Follow-up Date: 02/15/19 Follow-up type: In-patient    Ferne Coe [facility] 02/14/2019, 10:08 PM

## 2019-02-15 DIAGNOSIS — O9081 Anemia of the puerperium: Secondary | ICD-10-CM | POA: Diagnosis not present

## 2019-02-15 MED ORDER — FERROUS SULFATE 325 (65 FE) MG PO TABS: 325.0000 mg | ORAL_TABLET | Freq: Two times a day (BID) | ORAL | 3 refills | Status: DC

## 2019-02-15 MED ORDER — SENNOSIDES-DOCUSATE SODIUM 8.6-50 MG PO TABS: 2.0000 | ORAL_TABLET | ORAL | 0 refills | Status: DC

## 2019-02-15 NOTE — Discharge Summary (Signed)
SVD OB Discharge Summary     Patient Name: Kathryn Brandt DOB: 1996/04/11 MRN: [id number]  Date of admission: 02/13/2019 Delivering MD: Essie Hart  Date of delivery: 02/13/2019 Type of delivery: SVD  Newborn Data: Sex: Baby female Circumcision: out pt desired Live born female  Birth Weight: 8 lb 12.6 oz (3986 g) APGAR: 8, 9  Newborn Delivery   Birth date/time: 02/13/2019 18:17:00 Delivery type: Vaginal, Spontaneous      Feeding: breast and bottle Infant being discharge to home with mother in stable condition.   Admitting diagnosis: 39.6WKS BLEEDING Intrauterine pregnancy: [redacted]w[redacted]d     Secondary diagnosis:  Active Problems:   Normal labor   Gestational hypertension   Postpartum care following vaginal delivery   Normal postpartum course   Postpartum anemia                                Complications: None                                                              Intrapartum Procedures: spontaneous vaginal delivery Postpartum Procedures: none Complications-Operative and Postpartum: 2nd degree perineal laceration Augmentation: Pitocin   History of Present Illness: Kathryn Brandt is a 23 y.o. female, G1P1001, who presents at [redacted]w[redacted]d weeks gestation. The patient has been followed at  [facility]  Her pregnancy has been complicated by:  Patient Active Problem List   Diagnosis Date Noted  . Normal postpartum course 02/15/2019  . Postpartum anemia 02/15/2019  . Normal labor 02/13/2019  . Gestational hypertension 02/13/2019  . Postpartum care following vaginal delivery 02/13/2019    Hospital course:  Onset of Labor With Vaginal Delivery     23 y.o. yo G1P1001 at [redacted]w[redacted]d was admitted in Latent Labor on 02/13/2019. Patient had an uncomplicated labor course as follows:  Membrane Rupture Time/Date: 12:42 PM ,02/13/2019   Intrapartum Procedures: Episiotomy: None [1]                                         Lacerations:  2nd degree  [3];Vaginal [6]  Patient had a delivery of a Viable infant. 02/13/2019  Information for the patient's newborn:  Raychelle, [place]ud[place] [[id number]]       Pateint had an uncomplicated postpartum course.  She is ambulating, tolerating a regular diet, passing flatus, and urinating well. Patient is discharged home in stable condition on 02/15/19.  Postpartum Day # 2 : S/P NSVD due to PROM. Patient up ad lib, denies syncope or dizziness. Reports consuming regular diet without issues and denies N/V. Patient reports 0 bowel movement + passing flatus.  Denies issues with urination and reports bleeding is "lighter."  Patient is breast and boittlefeeding and reports going well.  Desires undecided for postpartum contraception.  Pain is being appropriately managed with use of po meds. HGB drop from 10.3-8.6 on iron, denies s/sx of anemia.   Physical exam  Vitals:   02/14/19 0535 02/14/19 1445 02/14/19 2125 02/15/19 0745  BP: 130/75 131/70 130/77 134/73  Pulse: 73 89 90 85  Resp: 18 16 18 18   Temp: )  97.5 F (36.4 C) 97.8 F (36.6 C) 98 F (36.7 C) 98.2 F (36.8 C)  TempSrc: Oral Oral Oral Oral  SpO2: 99% 98% 100% 100%  Weight:      Height:       General: alert, cooperative and no distress Lochia: appropriate Uterine Fundus: firm Perineum: Approximate, no hematomas.  DVT Evaluation: No evidence of DVT seen on physical exam. Negative Homan's sign. No cords or calf tenderness. No significant calf/ankle edema.  Labs: Lab Results  Component Value Date   WBC 15.4 (H) 02/14/2019   HGB 8.6 (L) 02/14/2019   HCT 27.4 (L) 02/14/2019   MCV 79.7 (L) 02/14/2019   PLT 205 02/14/2019   CMP Latest Ref Rng & Units 02/13/2019  Glucose 70 - 99 mg/dL 72  BUN 6 - 20 mg/dL 6  Creatinine 0.44 - 1.00 mg/dL 0.51  Sodium 135 - 145 mmol/L 136  Potassium 3.5 - 5.1 mmol/L 3.4(L)  Chloride 98 - 111 mmol/L 104  CO2 22 - 32 mmol/L 20(L)  Calcium 8.9 - 10.3 mg/dL 8.8(L)  Total Protein 6.5 - 8.1 g/dL 6.5   Total Bilirubin 0.3 - 1.2 mg/dL 0.8  Alkaline Phos 38 - 126 U/L 137(H)  AST 15 - 41 U/L 21  ALT 0 - 44 U/L 13    Date of discharge: 02/15/2019 Discharge Diagnoses: Term Pregnancy-delivered Discharge instruction: per After Visit Summary and "Baby and Me Booklet".  After visit meds:   Activity:           unrestricted and pelvic rest Advance as tolerated. Pelvic rest for 6 weeks.  Diet:                routine Medications: PNV, Ibuprofen, Colace and Iron Postpartum contraception: Undecided Condition:  Pt discharge to home with baby in stable GHTN: Monitor BP and if >150/90 BP then report, report s/sx HA, RUQ pain, vision changes. Anemia: Iron   Meds: Allergies as of 02/15/2019   No Known Allergies     Medication List    STOP taking these medications   cephALEXin 500 MG capsule Commonly known as: KEFLEX   polyethylene glycol 17 g packet Commonly known as: MIRALAX / GLYCOLAX     TAKE these medications   acetaminophen 500 MG tablet Commonly known as: TYLENOL Take 500 mg by mouth every 6 (six) hours as needed for mild pain or headache.   ferrous sulfate 325 (65 FE) MG tablet Take 1 tablet (325 mg total) by mouth 2 (two) times daily with a meal.   ibuprofen 800 MG tablet Commonly known as: ADVIL Take 1 tablet (800 mg total) by mouth 3 (three) times daily.   PRENATAL ADULT GUMMY/DHA/FA PO Take 1 tablet by mouth daily.   senna-docusate 8.6-50 MG tablet Commonly known as: Senokot-S Take 2 tablets by mouth daily. Start taking on: February 16, 2019       Discharge Follow Up:  Follow-up [facility]. Schedule an appointment as soon as possible for a visit.   Specialty: Obstetrics and Gynecology Why: Please call and make a 1 week circ appointment and BP check for yourself, then make a 6 week PPV.  Contact information: [address]. [address] [place]  [id number] [place], NP-C, CNM 02/15/2019, 10:13 AM  Noralyn Pick, [place] [facility]

## 2019-02-15 NOTE — Lactation Note (Addendum)
This note was copied from a baby's chart. Lactation Consultation Note  Patient Name: Kathryn Brandt [id number] Date: 02/15/2019 Reason for consult: Follow-up assessment;Difficult latch;Infant weight loss;Primapara;1st time breastfeeding  Baby is 41 hours old, 5 % weight loss.  LC reviewed and confirmed baby had just voided x 1 in life. Stools WNL.  Per mom Pedis doctor will recheck at 1 pm for more wets.  Baby due to feed. LC offered to assist and check the diaper - it was dry.  LC assisted to latch on the right breast / foot ball after mom massaged breast,  Hand expressed , and pre-pumped with a hand pump to make the nipple / areola complex more elastic for a deeper latch. Baby had fed the right breast with Latch of 8 and fed for 15 mins with swallows.  Sore nipples and engorgement prevention and tx reviewed.  Showed mom the hand pump, already familiar DEBP and has DEBP Lansinoh at home.  LC plan :  Shells between feedings except when sleeping.  Prior to latching - breast massage, hand express, pre-pump hand pump  And latch firm support, and compressions.  Feed 8-12 times a day around the clock.  Offer the 2nd breast .  Post pump after feedings.   Mom has the [facility] resources after D/C and phone numbers.     Maternal Data Has patient been taught Hand Expression?: Yes  Feeding Feeding Type: Breast Milk with Formula added Nipple Type: Slow - flow  LATCH Score ( Left breast and the areola had more edema and needs time )  Latch: Repeated attempts needed to sustain latch, nipple held in mouth throughout feeding, stimulation needed to elicit sucking reflex.(left breast - areola tissue needs help with shells - and prepumping)  Audible Swallowing: A few with stimulation  Type of Nipple: Flat  Comfort (Breast/Nipple): Soft / non-tender  Hold (Positioning): Assistance needed to correctly position infant at breast and maintain latch.  LATCH Score: 6  Interventions Interventions:  Breast feeding basics reviewed;Assisted with latch;Skin to skin;Breast massage;Hand express;Pre-pump if needed;Reverse pressure;Breast compression;Adjust position;Support pillows;Position options  Lactation Tools Discussed/Used Tools: Shells;Pump;Flanges Flange Size: 24 Shell Type: Inverted Breast pump type: Manual;Double-Electric Breast Pump WIC Program: Yes Pump Review: Milk Storage Initiated by:: Allayne Stack RN IBCLC Date initiated:: 02/15/19   Consult Status Consult Status: Complete Date: 02/15/19 Follow-up type: In-patient    [place] 02/15/2019, 10:22 AM

## 2019-02-15 NOTE — Lactation Note (Signed)
This note was copied from a baby's chart. Lactation Consultation Note Baby 75 hrs old. Asked mom if she wanted to use DEBP and mom stated yes she did. Mom shown how to use DEBP & how to disassemble, clean, & reassemble parts. Mom knows to pump q3h for 15-20 min. Mom encouraged to waken baby for feeds.  Gave mom pump and she was pumping when LC left. Milk storage reviewed. Encouraged to supplement w/BM before formula.  Patient Name: Kathryn Brandt Date: 02/15/2019 Reason for consult: Follow-up assessment;Primapara   Maternal Data    Feeding Feeding Type: Bottle Fed - Formula  LATCH Score       Type of Nipple: Everted at rest and after stimulation  Comfort (Breast/Nipple): Soft / non-tender        Interventions Interventions: DEBP;Breast massage;Hand express;Breast compression  Lactation Tools Discussed/Used Tools: Pump Breast pump type: Double-Electric Breast Pump Pump Review: Setup, frequency, and cleaning;Milk Storage Initiated by:: Allayne Stack RN IBCLC Date initiated:: 02/15/19   Consult Status Consult Status: Follow-up Date: 02/15/19 Follow-up type: In-patient    Luane Rochon, [place] 02/15/2019, 7:05 AM

## 2019-03-31 DIAGNOSIS — Z3040 Encounter for surveillance of contraceptives, unspecified: Secondary | ICD-10-CM | POA: Diagnosis not present

## 2019-04-01 ENCOUNTER — Telehealth: Payer: Self-pay | Admitting: Internal Medicine

## 2019-04-01 NOTE — Telephone Encounter (Signed)
I called pt twice and left a vm to call ofc. °

## 2019-04-02 ENCOUNTER — Encounter: Payer: Self-pay | Admitting: Internal Medicine

## 2019-04-02 ENCOUNTER — Ambulatory Visit (INDEPENDENT_AMBULATORY_CARE_PROVIDER_SITE_OTHER): Payer: Commercial Managed Care - PPO | Admitting: Internal Medicine

## 2019-04-02 VITALS — Ht 64.0 in | Wt 196.0 lb

## 2019-04-02 DIAGNOSIS — D509 Iron deficiency anemia, unspecified: Secondary | ICD-10-CM | POA: Diagnosis not present

## 2019-04-02 DIAGNOSIS — Z1329 Encounter for screening for other suspected endocrine disorder: Secondary | ICD-10-CM | POA: Diagnosis not present

## 2019-04-02 DIAGNOSIS — Z13220 Encounter for screening for lipoid disorders: Secondary | ICD-10-CM | POA: Diagnosis not present

## 2019-04-02 DIAGNOSIS — Z0000 Encounter for general adult medical examination without abnormal findings: Secondary | ICD-10-CM

## 2019-04-02 DIAGNOSIS — Z1389 Encounter for screening for other disorder: Secondary | ICD-10-CM

## 2019-04-02 DIAGNOSIS — E559 Vitamin D deficiency, unspecified: Secondary | ICD-10-CM

## 2019-04-02 NOTE — Progress Notes (Signed)
Patient scheduled for fasting labs 04/10/19.

## 2019-04-02 NOTE — Progress Notes (Signed)
Virtual Visit via Video Note  I connected with Kathryn Brandt  on 04/02/19 at  3:30 PM EST by a video enabled telemedicine application and verified that I am speaking with the correct person using two identifiers.  Location patient: home Location provider:work or home office Persons participating in the virtual visit: patient, provider, pts mom  I discussed the limitations of evaluation and management by telemedicine and the availability of in person appointments. The patient expressed understanding and agreed to proceed.   HPI: Annual  1. H/o iron def in pregnancy and now stopped taking x 2 weeks ago  No complaints today  Needs pcp   ROS: See pertinent positives and negatives per HPI. General: wt stable HEENT: no sore throat  CV:no chest pain  Lungs: no sob  GI: no ab pain  GU: no issues  MSK: no jt pain  Skin: no issues  Neuro: no h/a  Psych: no mood issues   Past Medical History:  Diagnosis Date  . Iron deficiency    in pregnancy     Past Surgical History:  Procedure Laterality Date  . WISDOM TOOTH EXTRACTION      Family History  Problem Relation Age of Onset  . Diabetes Father   . Diabetes Maternal Grandfather   . Diabetes Paternal Grandmother   . Iron deficiency Mother   . Diabetes Maternal Uncle   . Diabetes Maternal Grandmother   . Diabetes Other        GGM  . Colon cancer Other        m. great uncle     SOCIAL HX:  Mom Lamont Dowdy DPR (also Dr. [employer] patient) We can leave messages to patients phone if we call and she does not answer  1 son  Works in Airline pilot    Current Outpatient Medications:  .  acetaminophen (TYLENOL) 500 MG tablet, Take 500 mg by mouth every 6 (six) hours as needed for mild pain or headache., Disp: , Rfl:  .  ibuprofen (ADVIL,MOTRIN) 800 MG tablet, Take 1 tablet (800 mg total) by mouth 3 (three) times daily., Disp: 21 tablet, Rfl: 0 .  Prenatal MV & Min w/FA-DHA (PRENATAL ADULT GUMMY/DHA/FA PO), Take  1 tablet by mouth daily., Disp: , Rfl:  .  norethindrone (MICRONOR) 0.35 MG tablet, Take 1 tablet by mouth daily., Disp: , Rfl:   EXAM:  VITALS per patient if applicable:  GENERAL: alert, oriented, appears well and in no acute distress  HEENT: atraumatic, conjunttiva clear, no obvious abnormalities on inspection of external nose and ears  NECK: normal movements of the head and neck  LUNGS: on inspection no signs of respiratory distress, breathing rate appears normal, no obvious gross SOB, gasping or wheezing  CV: no obvious cyanosis  MS: moves all visible extremities without noticeable abnormality  PSYCH/NEURO: pleasant and cooperative, no obvious depression or anxiety, speech and thought processing grossly intact  ASSESSMENT AND PLAN:  Discussed the following assessment and plan:  Annual physical exam -  sch fasting labs asap  Flu shot declines if wants will offer with labs  Tdap had in 8 or 12/2018 with ob/gyn  HPV  Vaccine check with peds records  OB/GYN Dr. Mora [facility] Pap need to get copy from ob/gyn above rec healthy diet and exercise and responsible decisions  Vaccines check with [facility] in [place] Dr. Azucena Kuba.    Iron deficiency anemia, unspecified iron deficiency anemia type - Plan: CBC with Differential/Platelet, Iron, TIBC and Ferritin  Panel   -we discussed possible serious and likely etiologies, options for evaluation and workup, limitations of telemedicine visit vs in person visit, treatment, treatment risks and precautions. Pt prefers to treat via telemedicine empirically rather then risking or undertaking an in person visit at this moment. Patient agrees to seek prompt in person care if worsening, new symptoms arise, or if is not improving with treatment.   I discussed the assessment and treatment plan with the patient. The patient was provided an opportunity to ask questions and all were answered. The patient agreed with the plan and demonstrated an understanding  of the instructions.   The patient was advised to call back or seek an in-person evaluation if the symptoms worsen or if the condition fails to improve as anticipated.  Time spent 20-25 minutes  Delorise Jackson, MD

## 2019-04-07 ENCOUNTER — Encounter: Payer: Self-pay | Admitting: Internal Medicine

## 2019-04-07 DIAGNOSIS — Z0000 Encounter for general adult medical examination without abnormal findings: Secondary | ICD-10-CM | POA: Insufficient documentation

## 2019-04-08 ENCOUNTER — Telehealth: Payer: Self-pay | Admitting: Internal Medicine

## 2019-04-08 NOTE — Telephone Encounter (Signed)
I called pt and left a vm to call ofc to schedule Fasting labs asap and f/u in 6-12 months, and flu shot on nurse sch.

## 2019-04-10 ENCOUNTER — Encounter: Payer: Self-pay | Admitting: Internal Medicine

## 2019-04-10 ENCOUNTER — Other Ambulatory Visit (INDEPENDENT_AMBULATORY_CARE_PROVIDER_SITE_OTHER): Payer: Commercial Managed Care - PPO

## 2019-04-10 ENCOUNTER — Other Ambulatory Visit: Payer: Self-pay

## 2019-04-10 DIAGNOSIS — Z13220 Encounter for screening for lipoid disorders: Secondary | ICD-10-CM | POA: Diagnosis not present

## 2019-04-10 DIAGNOSIS — Z1389 Encounter for screening for other disorder: Secondary | ICD-10-CM | POA: Diagnosis not present

## 2019-04-10 DIAGNOSIS — D509 Iron deficiency anemia, unspecified: Secondary | ICD-10-CM

## 2019-04-10 DIAGNOSIS — Z0000 Encounter for general adult medical examination without abnormal findings: Secondary | ICD-10-CM

## 2019-04-10 DIAGNOSIS — E559 Vitamin D deficiency, unspecified: Secondary | ICD-10-CM | POA: Insufficient documentation

## 2019-04-10 DIAGNOSIS — Z1329 Encounter for screening for other suspected endocrine disorder: Secondary | ICD-10-CM | POA: Diagnosis not present

## 2019-04-10 DIAGNOSIS — E785 Hyperlipidemia, unspecified: Secondary | ICD-10-CM | POA: Insufficient documentation

## 2019-04-10 LAB — COMPREHENSIVE METABOLIC PANEL
ALT: 12 U/L (ref 0–35)
AST: 15 U/L (ref 0–37)
Albumin: 4.2 g/dL (ref 3.5–5.2)
Alkaline Phosphatase: 77 U/L (ref 39–117)
BUN: 13 mg/dL (ref 6–23)
CO2: 25 mEq/L (ref 19–32)
Calcium: 9.3 mg/dL (ref 8.4–10.5)
Chloride: 104 mEq/L (ref 96–112)
Creatinine, Ser: 0.64 mg/dL (ref 0.40–1.20)
GFR: 138.42 mL/min (ref >60.00)
Glucose, Bld: 82 mg/dL (ref 70–99)
Potassium: 3.8 mEq/L (ref 3.5–5.1)
Sodium: 139 mEq/L (ref 135–145)
Total Bilirubin: 0.7 mg/dL (ref 0.2–1.2)
Total Protein: 7.2 g/dL (ref 6.0–8.3)

## 2019-04-10 LAB — CBC WITH DIFFERENTIAL/PLATELET
Basophils Absolute: 0 10*3/uL (ref 0.0–0.1)
Basophils Relative: 0.5 % (ref 0.0–3.0)
Eosinophils Absolute: 0.1 10*3/uL (ref 0.0–0.7)
Eosinophils Relative: 1.4 % (ref 0.0–5.0)
HCT: 35.8 % — ABNORMAL LOW (ref 36.0–46.0)
Hemoglobin: 11.5 g/dL — ABNORMAL LOW (ref 12.0–15.0)
Lymphocytes Relative: 36.1 % (ref 12.0–46.0)
Lymphs Abs: 2.5 10*3/uL (ref 0.7–4.0)
MCHC: 32 g/dL (ref 30.0–36.0)
MCV: 77.5 fl — ABNORMAL LOW (ref 78.0–100.0)
Monocytes Absolute: 0.5 10*3/uL (ref 0.1–1.0)
Monocytes Relative: 7.2 % (ref 3.0–12.0)
Neutro Abs: 3.8 10*3/uL (ref 1.4–7.7)
Neutrophils Relative %: 54.8 % (ref 43.0–77.0)
Platelets: 353 10*3/uL (ref 150.0–400.0)
RBC: 4.62 Mil/uL (ref 3.87–5.11)
RDW: 19.3 % — ABNORMAL HIGH (ref 11.5–15.5)
WBC: 7 10*3/uL (ref 4.0–10.5)

## 2019-04-10 LAB — LIPID PANEL
Cholesterol: 230 mg/dL — ABNORMAL HIGH (ref 0–200)
HDL: 59.7 mg/dL (ref >39.00)
LDL Cholesterol: 154 mg/dL — ABNORMAL HIGH (ref 0–99)
NonHDL: 169.95
Total CHOL/HDL Ratio: 4
Triglycerides: 81 mg/dL (ref 0.0–149.0)
VLDL: 16.2 mg/dL (ref 0.0–40.0)

## 2019-04-10 LAB — TSH: TSH: 1.93 u[IU]/mL (ref 0.35–4.50)

## 2019-04-10 LAB — VITAMIN D 25 HYDROXY (VIT D DEFICIENCY, FRACTURES): VITD: 17.95 ng/mL — ABNORMAL LOW (ref 30.00–100.00)

## 2019-04-10 LAB — T4, FREE: Free T4: 0.87 ng/dL (ref 0.60–1.60)

## 2019-04-11 LAB — URINALYSIS, ROUTINE W REFLEX MICROSCOPIC
Bilirubin Urine: NEGATIVE
Glucose, UA: NEGATIVE
Hgb urine dipstick: NEGATIVE
Ketones, ur: NEGATIVE
Leukocytes,Ua: NEGATIVE
Nitrite: NEGATIVE
Protein, ur: NEGATIVE
Specific Gravity, Urine: 1.023 (ref 1.001–1.03)
pH: 5.5 (ref 5.0–8.0)

## 2019-04-11 LAB — IRON,TIBC AND FERRITIN PANEL
%SAT: 8 % (calc) — ABNORMAL LOW (ref 16–45)
Ferritin: 23 ng/mL (ref 16–154)
Iron: 32 ug/dL — ABNORMAL LOW (ref 40–190)
TIBC: 405 mcg/dL (calc) (ref 250–450)

## 2019-05-16 LAB — HM PAP SMEAR: HM Pap smear: NORMAL

## 2019-08-08 ENCOUNTER — Ambulatory Visit: Payer: Commercial Managed Care - PPO | Attending: Internal Medicine

## 2019-08-08 DIAGNOSIS — Z23 Encounter for immunization: Secondary | ICD-10-CM

## 2019-08-08 NOTE — Progress Notes (Signed)
   Covid-19 Vaccination Clinic  Name:  Kathryn Brandt    MRN: [id number] DOB: 1996-01-27  08/08/2019  [name] was observed post Covid-19 immunization for 15 minutes without incident. She was provided with Vaccine Information Sheet and instruction to access the V-Safe system.   [name] was instructed to call 911 with any severe reactions post vaccine: Marland Kitchen Difficulty breathing  . Swelling of face and throat  . A fast heartbeat  . A bad rash all over body  . Dizziness and weakness   Immunizations Administered    Name Date Dose VIS Date Route   Pfizer COVID-19 Vaccine 08/08/2019  4:34 PM 0.3 mL 04/11/2019 Intramuscular   Manufacturer: [employer], [employer]   Lot: [id number]   NDC: [address]-5

## 2019-09-01 ENCOUNTER — Ambulatory Visit: Payer: Commercial Managed Care - PPO | Attending: Internal Medicine

## 2019-09-01 DIAGNOSIS — Z23 Encounter for immunization: Secondary | ICD-10-CM

## 2019-09-01 NOTE — Progress Notes (Signed)
   Covid-19 Vaccination Clinic  Name:  TANICIA WOLAVER    MRN: [id number] DOB: 30-Apr-1996  09/01/2019  Ms. Stillman was observed post Covid-19 immunization for 15 minutes without incident. She was provided with Vaccine Information Sheet and instruction to access the V-Safe system.   Ms. Maciejewski was instructed to call 911 with any severe reactions post vaccine: Marland Kitchen Difficulty breathing  . Swelling of face and throat  . A fast heartbeat  . A bad rash all over body  . Dizziness and weakness   Immunizations Administered    Name Date Dose VIS Date Route   Pfizer COVID-19 Vaccine 09/01/2019  4:42 PM 0.3 mL 06/25/2018 Intramuscular   Manufacturer: [employer], [employer]   Lot: [id number]   NDC: [address]-1

## 2019-10-02 ENCOUNTER — Telehealth (INDEPENDENT_AMBULATORY_CARE_PROVIDER_SITE_OTHER): Payer: Commercial Managed Care - PPO | Admitting: Internal Medicine

## 2019-10-02 ENCOUNTER — Encounter: Payer: Self-pay | Admitting: Internal Medicine

## 2019-10-02 VITALS — Ht 64.0 in | Wt 196.0 lb

## 2019-10-02 DIAGNOSIS — L509 Urticaria, unspecified: Secondary | ICD-10-CM | POA: Diagnosis not present

## 2019-10-02 MED ORDER — HYDROXYZINE HCL 25 MG PO TABS: 25.0000 mg | ORAL_TABLET | Freq: Two times a day (BID) | ORAL | 0 refills | Status: DC | PRN

## 2019-10-02 MED ORDER — HYDROCORTISONE 2.5 % EX LOTN: TOPICAL_LOTION | Freq: Two times a day (BID) | CUTANEOUS | 2 refills | Status: DC | PRN

## 2019-10-02 NOTE — Progress Notes (Signed)
Virtual Visit via Video Note  I connected with Kathryn Brandt  on 10/02/19 at 10:15 AM EDT by a video enabled telemedicine application and verified that I am speaking with the correct person using two identifiers.  Location patient: home Location provider:work or home office Persons participating in the virtual visit: patient, provider  I discussed the limitations of evaluation and management by telemedicine and the availability of in person appointments. The patient expressed understanding and agreed to proceed.   HPI: 1. Hives since Sat. After only change was lobster had hives to face, arms and was itching. Has fine bumps but at times before when had hives been red. Tried Aveeno oat lotion last night. No new soaps uses all laundry detergent all free. Uses black aftrican black soap/shea butter, dove sensitive soap but black soap and shea butter only 2-3 x per day  She just returned from [place] Saturday and sx's started    ROS: See pertinent positives and negatives per HPI.  Past Medical History:  Diagnosis Date  . Iron deficiency    in pregnancy     Past Surgical History:  Procedure Laterality Date  . WISDOM TOOTH EXTRACTION      Family History  Problem Relation Age of Onset  . Diabetes Father   . Diabetes Maternal Grandfather   . Diabetes Paternal Grandmother   . Iron deficiency Mother   . Diabetes Maternal Uncle   . Diabetes Maternal Grandmother   . Diabetes Other        GGM  . Colon cancer Other        m. great uncle     SOCIAL HX: lives home with son   Current Outpatient Medications:  .  norethindrone (MICRONOR) 0.35 MG tablet, Take 1 tablet by mouth daily., Disp: , Rfl:  .  acetaminophen (TYLENOL) 500 MG tablet, Take 500 mg by mouth every 6 (six) hours as needed for mild pain or headache., Disp: , Rfl:  .  hydrocortisone 2.5 % lotion, Apply topically 2 (two) times daily as needed., Disp: 118 mL, Rfl: 2 .  hydrOXYzine (ATARAX/VISTARIL) 25 MG tablet, Take 1-2  tablets (25-50 mg total) by mouth 2 (two) times daily as needed. Or 1-2 pills at night if making your drowsy, Disp: 60 tablet, Rfl: 0 .  ibuprofen (ADVIL,MOTRIN) 800 MG tablet, Take 1 tablet (800 mg total) by mouth 3 (three) times daily. (Patient not taking: Reported on 10/02/2019), Disp: 21 tablet, Rfl: 0  EXAM:  VITALS per patient if applicable:  GENERAL: alert, oriented, appears well and in no acute distress  HEENT: atraumatic, conjunttiva clear, no obvious abnormalities on inspection of external nose and ears  NECK: normal movements of the head and neck  LUNGS: on inspection no signs of respiratory distress, breathing rate appears normal, no obvious gross SOB, gasping or wheezing  CV: no obvious cyanosis  MS: moves all visible extremities without noticeable abnormality  PSYCH/NEURO: pleasant and cooperative, no obvious depression or anxiety, speech and thought processing grossly intact  Skin: fine bumps to forehead/arms   ASSESSMENT AND PLAN:  Discussed the following assessment and plan:  Hives - Plan: hydrOXYzine (ATARAX/VISTARIL) 25 MG tablet, hydrocortisone 2.5 % lotion Consider derm or allergy if hives return in the future  Avoid fragrance  -we discussed possible serious and likely etiologies, options for evaluation and workup, limitations of telemedicine visit vs in person visit, treatment, treatment risks and precautions. Pt prefers to treat via telemedicine empirically rather then risking or undertaking an in person visit at  this moment. Patient agrees to seek prompt in person care if worsening, new symptoms arise, or if is not improving with treatment.   I discussed the assessment and treatment plan with the patient. The patient was provided an opportunity to ask questions and all were answered. The patient agreed with the plan and demonstrated an understanding of the instructions.   The patient was advised to call back or seek an in-person evaluation if the symptoms  worsen or if the condition fails to improve as anticipated.  Time spent 20 min Bevelyn Buckles, MD

## 2019-10-02 NOTE — Patient Instructions (Signed)
Try over the counter claritin,xyzal, allegra or zyrtec  Will send hydrocortisone and atarax/hydroxyzine  Hives Hives (urticaria) are itchy, red, swollen areas on the skin. Hives can appear on any part of the body. Hives often fade within 24 hours (acute hives). Sometimes, new hives appear after old ones fade and the cycle can continue for several days or weeks (chronic hives). Hives do not spread from person to person (are not contagious). Hives come from the body's reaction to something a person is allergic to (allergen), something that causes irritation, or various other triggers. When a person is exposed to a trigger, his or her body releases a chemical (histamine) that causes redness, itching, and swelling. Hives can appear right after exposure to a trigger or hours later. What are the causes? This condition may be caused by:  Allergies to foods or ingredients.  Insect bites or stings.  Exposure to pollen or pets.  Contact with latex or chemicals.  Spending time in sunlight, heat, or cold (exposure).  Exercise.  Stress.  Certain medicines. You can also get hives from other medical conditions and treatments, such as:  Viruses, including the common cold.  Bacterial infections, such as urinary tract infections and strep throat.  Certain medicines.  Allergy shots.  Blood transfusions. Sometimes, the cause of this condition is not known (idiopathic hives). What increases the risk? You are more likely to develop this condition if you:  Are a woman.  Have food allergies, especially to citrus fruits, milk, eggs, peanuts, tree nuts, or shellfish.  Are allergic to: ? Medicines. ? Latex. ? Insects. ? Animals. ? Pollen. What are the signs or symptoms? Common symptoms of this condition include raised, itchy, red or white bumps or patches on your skin. These areas may:  Become large and swollen (welts).  Change in shape and location, quickly and repeatedly.  Be separate  hives or connect over a large area of skin.  Sting or become painful.  Turn white when pressed in the center (blanch). In severe cases, yourhands, feet, and face may also become swollen. This may occur if hives develop deeper in your skin. How is this diagnosed? This condition may be diagnosed by your symptoms, medical history, and physical exam.  Your skin, urine, or blood may be tested to find out what is causing your hives and to rule out other health issues.  Your health care provider may also remove a small sample of skin from the affected area and examine it under a microscope (biopsy). How is this treated? Treatment for this condition depends on the cause and severity of your symptoms. Your health care provider may recommend using cool, wet cloths (cool compresses) or taking cool showers to relieve itching. Treatment may include:  Medicines that help: ? Relieve itching (antihistamines). ? Reduce swelling (corticosteroids). ? Treat infection (antibiotics).  An injectable medicine (omalizumab). Your health care provider may prescribe this if you have chronic idiopathic hives and you continue to have symptoms even after treatment with antihistamines. Severe cases may require an emergency injection of adrenaline (epinephrine) to prevent a life-threatening allergic reaction (anaphylaxis). Follow these instructions at home: Medicines  Take and apply over-the-counter and prescription medicines only as told by your health care provider.  If you were prescribed an antibiotic medicine, take it as told by your health care provider. Do not stop using the antibiotic even if you start to feel better. Skin care  Apply cool compresses to the affected areas.  Do not scratch or rub your  skin. General instructions  Do not take hot showers or baths. This can make itching worse.  Do not wear tight-fitting clothing.  Use sunscreen and wear protective clothing when you are outside.  Avoid  any substances that cause your hives. Keep a journal to help track what causes your hives. Write down: ? What medicines you take. ? What you eat and drink. ? What products you use on your skin.  Keep all follow-up visits as told by your health care provider. This is important. Contact a health care provider if:  Your symptoms are not controlled with medicine.  Your joints are painful or swollen. Get help right away if:  You have a fever.  You have pain in your abdomen.  Your tongue or lips are swollen.  Your eyelids are swollen.  Your chest or throat feels tight.  You have trouble breathing or swallowing. These symptoms may represent a serious problem that is an emergency. Do not wait to see if the symptoms will go away. Get medical help right away. Call your local emergency services (911 in the [place] Do not drive yourself to the hospital. Summary  Hives (urticaria) are itchy, red, swollen areas on your skin. Hives come from the body's reaction to something a person is allergic to (allergen), something that causes irritation, or various other triggers.  Treatment for this condition depends on the cause and severity of your symptoms.  Avoid any substances that cause your hives. Keep a journal to help track what causes your hives.  Take and apply over-the-counter and prescription medicines only as told by your health care provider.  Keep all follow-up visits as told by your health care provider. This is important. This information is not intended to replace advice given to you by your health care provider. Make sure you discuss any questions you have with your health care provider. Document Revised: 10/31/2017 Document Reviewed: 10/31/2017 Elsevier Patient Education  [place].

## 2020-01-06 ENCOUNTER — Other Ambulatory Visit: Payer: Self-pay

## 2020-01-08 ENCOUNTER — Other Ambulatory Visit: Payer: Self-pay

## 2020-01-08 ENCOUNTER — Encounter: Payer: Self-pay | Admitting: Nurse Practitioner

## 2020-01-08 ENCOUNTER — Ambulatory Visit (INDEPENDENT_AMBULATORY_CARE_PROVIDER_SITE_OTHER): Payer: Commercial Managed Care - PPO | Admitting: Nurse Practitioner

## 2020-01-08 VITALS — BP 122/68 | HR 77 | Temp 98.7°F | Ht 64.02 in | Wt 207.8 lb

## 2020-01-08 DIAGNOSIS — R21 Rash and other nonspecific skin eruption: Secondary | ICD-10-CM | POA: Diagnosis not present

## 2020-01-08 NOTE — Patient Instructions (Signed)
Lotrimin cream- over the counter and apply twice a day to affected area. Marland Kitchen  Keep it dry  Please call next week with update.   If it does not improve- we will need to change therapy.

## 2020-01-08 NOTE — Progress Notes (Signed)
Established Patient Office Visit  Subjective:  Patient ID: Kathryn Brandt, female    DOB: [birth year]  Age: 24 y.o. MRN: [id number]  CC:  Chief Complaint  Patient presents with  . Finger Injury    pt c/o possible infection of right little finger. x55months. Pt had her nails done and right little finger has had pus and stinging when washing her hands.    HPI Kathryn Brandt is a 24 year old who presents for possible infection on her  Finger onset 2 months ago.  She thinks it started when she went to the nail salon and placed her hands in the [employer] dry powder.  A few days later, she noted some irritation in that pinky finger, but no other fingers bothered her.  Over the following weeks, she did get a little dry patch along her web of her third and fourth finger, and the tip of her pinky finger became irritated and the nail lifted a little and drained a white spot- maybe pus, with dry skin.  She complains of applied Neosporin, moisturizer, and has been monitoring this without improvement.  She does not have a history of psoriasis, eczema, or skin allergies.   Past Medical History:  Diagnosis Date  . Iron deficiency    in pregnancy     Past Surgical History:  Procedure Laterality Date  . WISDOM TOOTH EXTRACTION      Family History  Problem Relation Age of Onset  . Diabetes Father   . Diabetes Maternal Grandfather   . Diabetes Paternal Grandmother   . Iron deficiency Mother   . Diabetes Maternal Uncle   . Diabetes Maternal Grandmother   . Diabetes Other        GGM  . Colon cancer Other        m. great uncle     Social History   Socioeconomic History  . Marital status: Single    Spouse name: Not on file  . Number of children: Not on file  . Years of education: Not on file  . Highest education level: Not on file  Occupational History  . Not on file  Tobacco Use  . Smoking status: Never Smoker  . Smokeless tobacco: Never Used  Substance and Sexual Activity  . Alcohol  use: Yes    Comment: occas.  . Drug use: Never  . Sexual activity: Yes    Partners: Male  Other Topics Concern  . Not on file  Social History Narrative   [name] (also Dr. [employer] patient)   We can leave messages to patients phone if we call and she does not answer. 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 Difficulty of Paying Living Expenses: Not on file  Food Insecurity:   . Worried About Programme researcher, broadcasting/film/video in the Last Year: Not on file  . Ran Out of Food in the Last Year: Not on file  Transportation Needs:   . Lack of Transportation (Medical): Not on file  . Lack of Transportation (Non-Medical): Not on file  Physical Activity:   . Days of Exercise per Week: Not on file  . Minutes of Exercise per Session: Not on file  Stress:   . Feeling of Stress : Not on file  Social Connections:   . Frequency  of Communication with Friends and Family: Not on file  . Frequency of Social Gatherings with Friends and Family: Not on file  . Attends Religious Services: Not on file  . Active Member of Clubs or Organizations: Not on file  . Attends Banker Meetings: Not on file  . Marital Status: Not on file  Intimate Partner Violence:   . Fear of Current or Ex-Partner: Not on file  . Emotionally Abused: Not on file  . Physically Abused: Not on file  . Sexually Abused: Not on file    Outpatient Medications Prior to Visit  Medication Sig Dispense Refill  . acetaminophen (TYLENOL) 500 MG tablet Take 500 mg by mouth every 6 (six) hours as needed for mild pain or headache.    . hydrocortisone 2.5 % lotion Apply topically 2 (two) times daily as needed. (Patient not taking: Reported on 01/08/2020) 118 mL 2  . hydrOXYzine (ATARAX/VISTARIL) 25 MG tablet Take 1-2 tablets (25-50 mg total) by mouth 2 (two) times daily as needed. Or 1-2 pills at night if  making your drowsy (Patient not taking: Reported on 01/08/2020) 60 tablet 0  . ibuprofen (ADVIL,MOTRIN) 800 MG tablet Take 1 tablet (800 mg total) by mouth 3 (three) times daily. (Patient not taking: Reported on 10/02/2019) 21 tablet 0  . norethindrone (MICRONOR) 0.35 MG tablet Take 1 tablet by mouth daily. (Patient not taking: Reported on 01/08/2020)     No facility-administered medications prior to visit.    No Known Allergies  Review of Systems  Constitutional: Negative for chills, fatigue and fever.  Skin: Positive for rash.  All other systems reviewed and are negative.     Objective:    Physical Exam Vitals reviewed.  Constitutional:      Appearance: Normal appearance.  Skin:    General: Skin is warm and dry.     Comments: Right 5th finger  has rough, dry skin dorsal and ventral surfaces  and no sign of redness or drainage. Cracked skin folds and the entire finger is involved- not into the palm.  This has a fungal appearance. The web space of 3rd and 4th finger and a dry skin patch- similar. No other nails or fingers affected.   Neurological:     Mental Status: She is alert.     BP 122/68 (BP Location: Left Arm, Patient Position: Sitting)   Pulse 77   Temp 98.7 F (37.1 C)   Ht 5' 4.02" (1.626 m)   Wt 207 lb 12.8 oz (94.3 kg)   SpO2 99%   BMI 35.65 kg/m  Wt Readings from Last 3 Encounters:  01/08/20 207 lb 12.8 oz (94.3 kg)  10/02/19 196 lb (88.9 kg)  04/02/19 196 lb (88.9 kg)   Pulse Readings from Last 3 Encounters:  01/08/20 77  02/15/19 85  06/11/16 69    BP Readings from Last 3 Encounters:  01/08/20 122/68  02/15/19 134/73  06/11/16 (!) 119/52      Lab Results  Component Value Date   CHOL 230 (H) 04/10/2019   HDL 59.70 04/10/2019   LDLCALC 154 (H) 04/10/2019   TRIG 81.0 04/10/2019   CHOLHDL 4 04/10/2019      Health Maintenance Due  Topic Date Due  . Hepatitis C Screening  Never done  . TETANUS/TDAP  Never done  . PAP-Cervical Cytology Screening   Never done  . PAP SMEAR-Modifier  Never done  . INFLUENZA VACCINE  Never done    There are no preventive care reminders  to display for this patient.    Assessment & Plan:   Problem List Items Addressed This Visit      Musculoskeletal and Integument   Rash of finger - Primary     Dr. Lorin Picket was in to examine finger and webspace between 3rd-4th fingers.  impression is this is most likely a fungal infection.   Lotrimin cream- over the counter and apply twice a day to affected area. Marland Kitchen  Keep it dry  Please call next week with update.   If it does not improve- we will need to change therapy.   No orders of the defined types were placed in this encounter.   Follow-up: Return in about 2 weeks (around 01/22/2020), or if symptoms worsen or fail to improve.   This visit occurred during the SARS-CoV-2 public health emergency.  Safety protocols were in place, including screening questions prior to the visit, additional usage of staff PPE, and extensive cleaning of exam room while observing appropriate contact time as indicated for disinfecting solutions.   Amedeo Kinsman, NP

## 2020-01-11 ENCOUNTER — Encounter: Payer: Self-pay | Admitting: Nurse Practitioner

## 2020-04-13 ENCOUNTER — Ambulatory Visit: Payer: Commercial Managed Care - PPO | Admitting: Internal Medicine

## 2020-04-19 ENCOUNTER — Other Ambulatory Visit: Payer: Self-pay

## 2020-04-22 ENCOUNTER — Encounter: Payer: Self-pay | Admitting: Internal Medicine

## 2020-04-22 ENCOUNTER — Ambulatory Visit (INDEPENDENT_AMBULATORY_CARE_PROVIDER_SITE_OTHER): Payer: Commercial Managed Care - PPO | Admitting: Internal Medicine

## 2020-04-22 ENCOUNTER — Other Ambulatory Visit: Payer: Self-pay

## 2020-04-22 ENCOUNTER — Other Ambulatory Visit: Payer: Self-pay | Admitting: Internal Medicine

## 2020-04-22 VITALS — BP 126/82 | HR 89 | Temp 99.0°F | Ht 64.02 in | Wt 211.2 lb

## 2020-04-22 DIAGNOSIS — E559 Vitamin D deficiency, unspecified: Secondary | ICD-10-CM | POA: Diagnosis not present

## 2020-04-22 DIAGNOSIS — Z113 Encounter for screening for infections with a predominantly sexual mode of transmission: Secondary | ICD-10-CM

## 2020-04-22 DIAGNOSIS — E611 Iron deficiency: Secondary | ICD-10-CM

## 2020-04-22 DIAGNOSIS — Z1329 Encounter for screening for other suspected endocrine disorder: Secondary | ICD-10-CM | POA: Diagnosis not present

## 2020-04-22 DIAGNOSIS — L720 Epidermal cyst: Secondary | ICD-10-CM | POA: Diagnosis not present

## 2020-04-22 DIAGNOSIS — Z0000 Encounter for general adult medical examination without abnormal findings: Secondary | ICD-10-CM

## 2020-04-22 DIAGNOSIS — Z1389 Encounter for screening for other disorder: Secondary | ICD-10-CM | POA: Diagnosis not present

## 2020-04-22 LAB — CBC WITH DIFFERENTIAL/PLATELET
Basophils Absolute: 0 10*3/uL (ref 0.0–0.1)
Basophils Relative: 0.4 % (ref 0.0–3.0)
Eosinophils Absolute: 0.1 10*3/uL (ref 0.0–0.7)
Eosinophils Relative: 0.7 % (ref 0.0–5.0)
HCT: 34.1 % — ABNORMAL LOW (ref 36.0–46.0)
Hemoglobin: 10.8 g/dL — ABNORMAL LOW (ref 12.0–15.0)
Lymphocytes Relative: 29.2 % (ref 12.0–46.0)
Lymphs Abs: 2.4 10*3/uL (ref 0.7–4.0)
MCHC: 31.8 g/dL (ref 30.0–36.0)
MCV: 73.5 fl — ABNORMAL LOW (ref 78.0–100.0)
Monocytes Absolute: 0.5 10*3/uL (ref 0.1–1.0)
Monocytes Relative: 6 % (ref 3.0–12.0)
Neutro Abs: 5.3 10*3/uL (ref 1.4–7.7)
Neutrophils Relative %: 63.7 % (ref 43.0–77.0)
Platelets: 388 10*3/uL (ref 150.0–400.0)
RBC: 4.64 Mil/uL (ref 3.87–5.11)
RDW: 16.5 % — ABNORMAL HIGH (ref 11.5–15.5)
WBC: 8.4 10*3/uL (ref 4.0–10.5)

## 2020-04-22 LAB — TSH: TSH: 1.51 u[IU]/mL (ref 0.35–4.50)

## 2020-04-22 LAB — COMPREHENSIVE METABOLIC PANEL
ALT: 9 U/L (ref 0–35)
AST: 13 U/L (ref 0–37)
Albumin: 4.2 g/dL (ref 3.5–5.2)
Alkaline Phosphatase: 81 U/L (ref 39–117)
BUN: 12 mg/dL (ref 6–23)
CO2: 29 mEq/L (ref 19–32)
Calcium: 9.5 mg/dL (ref 8.4–10.5)
Chloride: 104 mEq/L (ref 96–112)
Creatinine, Ser: 0.65 mg/dL (ref 0.40–1.20)
GFR: 123.05 mL/min (ref >60.00)
Glucose, Bld: 85 mg/dL (ref 70–99)
Potassium: 4.4 mEq/L (ref 3.5–5.1)
Sodium: 139 mEq/L (ref 135–145)
Total Bilirubin: 0.8 mg/dL (ref 0.2–1.2)
Total Protein: 7.6 g/dL (ref 6.0–8.3)

## 2020-04-22 LAB — LIPID PANEL
Cholesterol: 160 mg/dL (ref 0–200)
HDL: 48.8 mg/dL (ref >39.00)
LDL Cholesterol: 100 mg/dL — ABNORMAL HIGH (ref 0–99)
NonHDL: 111.44
Total CHOL/HDL Ratio: 3
Triglycerides: 59 mg/dL (ref 0.0–149.0)
VLDL: 11.8 mg/dL (ref 0.0–40.0)

## 2020-04-22 LAB — VITAMIN D 25 HYDROXY (VIT D DEFICIENCY, FRACTURES): VITD: 19.96 ng/mL — ABNORMAL LOW (ref 30.00–100.00)

## 2020-04-22 MED ORDER — DOXYCYCLINE HYCLATE 100 MG PO TABS: 100.0000 mg | ORAL_TABLET | Freq: Two times a day (BID) | ORAL | 0 refills | Status: DC

## 2020-04-22 MED ORDER — CHOLECALCIFEROL 1.25 MG (50000 UT) PO CAPS: 50000.0000 [IU] | ORAL_CAPSULE | ORAL | 1 refills | Status: DC

## 2020-04-22 NOTE — Patient Instructions (Signed)
Consider booster pfizer shot in the future

## 2020-04-22 NOTE — Progress Notes (Signed)
Chief Complaint  Patient presents with  . Breast Pain  . Breast Mass   Annual with son toddler today  1. Right mid chest cyst increased in size since 04/21/20 and painful last night after warm compression    Review of Systems  Constitutional: Positive for weight loss.  HENT: Negative for hearing loss.   Eyes: Negative for blurred vision.  Respiratory: Negative for shortness of breath.   Cardiovascular: Negative for chest pain.  Gastrointestinal: Negative for abdominal pain.  Musculoskeletal: Negative for falls.  Skin: Negative for rash.  Psychiatric/Behavioral: Negative for depression.   Past Medical History:  Diagnosis Date  . Iron deficiency    in pregnancy    Past Surgical History:  Procedure Laterality Date  . WISDOM TOOTH EXTRACTION     Family History  Problem Relation Age of Onset  . Diabetes Father   . Diabetes Maternal Grandfather   . Diabetes Paternal Grandmother   . Iron deficiency Mother   . Diabetes Maternal Uncle   . Diabetes Maternal Grandmother   . Diabetes Other        GGM  . Colon cancer Other        m. great uncle    Social History   Socioeconomic History  . Marital status: Single    Spouse name: Not on file  . Number of children: Not on file  . Years of education: Not on file  . Highest education level: Not on file  Occupational History  . Not on file  Tobacco Use  . Smoking status: Never Smoker  . Smokeless tobacco: Never Used  Substance and Sexual Activity  . Alcohol use: Yes    Comment: occas.  . Drug use: Never  . Sexual activity: Yes    Partners: Male  Other Topics Concern  . Not on file  Social History Narrative   Mom Alaila Pillard DPR (also Dr. [employer] patient)   We can leave messages to patients phone if we call and she does not answer. 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 acetaminophen (TYLENOL) 500 MG tablet Take 500 mg by mouth every 6 (six) hours as needed for mild pain or headache.   No Known Allergies No results found for this or any previous visit (from the past 2160 hour(s)). Objective  Body mass index is 36.23 kg/m. Wt Readings from Last 3 Encounters:  04/22/20 211 lb 3.2 oz (95.8 kg)  01/08/20 207 lb 12.8 oz (94.3 kg)  10/02/19 196 lb (88.9 kg)   Temp Readings from Last 3 Encounters:  04/22/20 99 F (37.2 C) (Oral)  01/08/20 98.7 F (37.1 C)  02/15/19 98.2 F (36.8 C) (Oral)   BP Readings from Last 3 Encounters:  04/22/20 126/82  01/08/20 122/68  02/15/19 134/73   Pulse Readings from Last 3 Encounters:  04/22/20 89  01/08/20 77  02/15/19 85    Physical Exam Vitals and nursing note reviewed.  Constitutional:      Appearance: Normal appearance. She is well-developed and well-groomed. She is obese.  HENT:  Head: Normocephalic and atraumatic.  Eyes:     Conjunctiva/sclera: Conjunctivae normal.     Pupils: Pupils are equal, round, and reactive to light.  Cardiovascular:     Rate and Rhythm: Normal rate and regular rhythm.     Heart sounds: Normal heart sounds. No murmur heard.   Pulmonary:     Effort: Pulmonary effort is normal.     Breath sounds: Normal breath sounds.  Chest:    Skin:    General: Skin is warm and dry.  Neurological:     General: No focal deficit present.     Mental Status: She is alert and oriented to person, place, and time. Mental status is at baseline.     Gait: Gait normal.  Psychiatric:        Attention and Perception: Attention and perception normal.        Mood and Affect: Mood and affect normal.        Speech: Speech normal.        Behavior: Behavior normal. Behavior  is cooperative.        Thought Content: Thought content normal.        Cognition and Memory: Cognition and memory normal.        Judgment: Judgment normal.     Assessment  Plan  Annual physical exam -  sch fasting labs asap  Flu shot declines  Pfizer 2/2 consider booster  Tdap had in 8 or 12/2018 with ob/gyn  HPV  Vaccine check with peds records  OB/GYN Dr. Mora [facility] Pap need to get copy from ob/gyn above rec healthy diet and exercise and responsible decisions  Vaccines check with [facility] in [place] Dr. Azucena Kuba.  EIC (epidermal inclusion cyst) - Plan: doxycycline (VIBRA-TABS) 100 MG tablet, Ambulatory referral to [facility] Dr. Zannie Kehr   Provider: Dr. French Ana McLean-Scocuzza-Internal Medicine

## 2020-04-23 LAB — URINALYSIS, ROUTINE W REFLEX MICROSCOPIC
Bilirubin Urine: NEGATIVE
Glucose, UA: NEGATIVE
Hgb urine dipstick: NEGATIVE
Ketones, ur: NEGATIVE
Leukocytes,Ua: NEGATIVE
Nitrite: NEGATIVE
Protein, ur: NEGATIVE
Specific Gravity, Urine: 1.028 (ref 1.001–1.03)
pH: 5.5 (ref 5.0–8.0)

## 2020-04-23 LAB — IRON,TIBC AND FERRITIN PANEL
%SAT: 4 % (calc) — ABNORMAL LOW (ref 16–45)
Ferritin: 10 ng/mL — ABNORMAL LOW (ref 16–154)
Iron: 19 ug/dL — ABNORMAL LOW (ref 40–190)
TIBC: 430 mcg/dL (calc) (ref 250–450)

## 2020-04-26 ENCOUNTER — Telehealth: Payer: Self-pay | Admitting: Internal Medicine

## 2020-04-26 LAB — HEPATITIS C ANTIBODY
Hepatitis C Ab: NONREACTIVE
SIGNAL TO CUT-OFF: 0.01 (ref <1.00)

## 2020-04-26 LAB — HIV ANTIBODY (ROUTINE TESTING W REFLEX): HIV 1&2 Ab, 4th Generation: NONREACTIVE

## 2020-04-26 NOTE — Telephone Encounter (Signed)
-----   Message from Bevelyn Buckles, MD sent at 04/22/2020  4:00 PM EST ----- Vitamin D low sent D3 1x per week x 6 months then take 4000 Iu daily vitamin D3 starting month 7  Cholesterol improved  She is anemic have labs pending   Some labs pending

## 2020-04-26 NOTE — Telephone Encounter (Signed)
No answer, no voicemail.

## 2020-04-29 ENCOUNTER — Telehealth: Payer: Self-pay | Admitting: Internal Medicine

## 2020-04-29 NOTE — Telephone Encounter (Signed)
She had [employer] and medicaid can we call this office she is also established with [facility] and seen before

## 2020-04-29 NOTE — Telephone Encounter (Signed)
Ok please refer to [facility] dermatology inform pt of this change in referral   Thank you

## 2020-04-29 NOTE — Telephone Encounter (Signed)
"  Rejection Reason - Other - we do not accept Medicaid please refer elsewhere Leanord Asal from [facility] [phone]" [facility] said on Apr 28, 2020 10:23 AM  Please advise and Thank you!

## 2020-04-29 NOTE — Telephone Encounter (Signed)
I called [facility] and I advise to them that pt has [facility] I was advised that pt has medicaid has secondary and they do not except medicaid even though pt has a primary ins that they can except.

## 2020-05-03 NOTE — Telephone Encounter (Signed)
Good morning!  I called pt to inform pt of the derm change pt vm is full.

## 2020-05-03 NOTE — Telephone Encounter (Signed)
Can you try her mother or emergency contact please for referral then  Thanks

## 2020-05-04 NOTE — Telephone Encounter (Signed)
Noted thank you

## 2020-05-04 NOTE — Telephone Encounter (Signed)
Pt called me back and I spoke with her regarding the derm referral. Pt agreed to me sending the referral to [facility].

## 2020-05-06 ENCOUNTER — Telehealth: Payer: Self-pay

## 2020-05-06 NOTE — Telephone Encounter (Signed)
Can you help she needs cyst removed   This will need to be cut out   Can we try [facility] derm?

## 2020-05-06 NOTE — Telephone Encounter (Signed)
Pt called and states that the cyst has burst and has not heard from [facility] about referral. There are no appts avail at time of call. Please advise

## 2020-05-10 NOTE — Telephone Encounter (Signed)
Noted  

## 2020-05-10 NOTE — Telephone Encounter (Signed)
Good morning!  [facility] derm is not excepting NP right now. The referral was sent to [facility] pt has not been scheduled yet I called twice no one picks up. I called [facility] and I spoke with someone I'm faxing pt referral over to [employer]. Pt was notified.

## 2021-04-26 ENCOUNTER — Telehealth: Payer: Self-pay | Admitting: Internal Medicine

## 2021-04-26 ENCOUNTER — Encounter: Payer: Commercial Managed Care - PPO | Admitting: Internal Medicine

## 2021-04-26 NOTE — Telephone Encounter (Signed)
Patient no-showed today's appointment; appointment was for 04/26/21, provider notified for review of record. Letter sent for patient to call in and re-schedule no fee applied to the no show.

## 2021-05-04 ENCOUNTER — Other Ambulatory Visit: Payer: Self-pay

## 2021-05-04 ENCOUNTER — Encounter: Payer: Self-pay | Admitting: Internal Medicine

## 2021-05-04 ENCOUNTER — Ambulatory Visit (INDEPENDENT_AMBULATORY_CARE_PROVIDER_SITE_OTHER): Payer: Commercial Managed Care - PPO | Admitting: Internal Medicine

## 2021-05-04 VITALS — BP 122/68 | HR 87 | Temp 97.2°F | Ht 64.02 in | Wt 201.4 lb

## 2021-05-04 DIAGNOSIS — Z0000 Encounter for general adult medical examination without abnormal findings: Secondary | ICD-10-CM | POA: Diagnosis not present

## 2021-05-04 DIAGNOSIS — L219 Seborrheic dermatitis, unspecified: Secondary | ICD-10-CM

## 2021-05-04 DIAGNOSIS — L309 Dermatitis, unspecified: Secondary | ICD-10-CM

## 2021-05-04 DIAGNOSIS — E785 Hyperlipidemia, unspecified: Secondary | ICD-10-CM | POA: Diagnosis not present

## 2021-05-04 DIAGNOSIS — E559 Vitamin D deficiency, unspecified: Secondary | ICD-10-CM

## 2021-05-04 DIAGNOSIS — Z1389 Encounter for screening for other disorder: Secondary | ICD-10-CM

## 2021-05-04 DIAGNOSIS — Z1329 Encounter for screening for other suspected endocrine disorder: Secondary | ICD-10-CM

## 2021-05-04 DIAGNOSIS — R739 Hyperglycemia, unspecified: Secondary | ICD-10-CM

## 2021-05-04 DIAGNOSIS — G44209 Tension-type headache, unspecified, not intractable: Secondary | ICD-10-CM

## 2021-05-04 DIAGNOSIS — E611 Iron deficiency: Secondary | ICD-10-CM | POA: Diagnosis not present

## 2021-05-04 DIAGNOSIS — L720 Epidermal cyst: Secondary | ICD-10-CM

## 2021-05-04 MED ORDER — CHOLECALCIFEROL 1.25 MG (50000 UT) PO CAPS: 50000.0000 [IU] | ORAL_CAPSULE | ORAL | 1 refills | Status: DC

## 2021-05-04 MED ORDER — CLOBETASOL PROPIONATE 0.05 % EX CREA: 1.0000 "application " | TOPICAL_CREAM | Freq: Two times a day (BID) | CUTANEOUS | 2 refills | Status: DC

## 2021-05-04 MED ORDER — RIZATRIPTAN BENZOATE 5 MG PO TABS: 5.0000 mg | ORAL_TABLET | ORAL | 2 refills | Status: DC | PRN

## 2021-05-04 NOTE — Progress Notes (Signed)
Chief Complaint  Patient presents with   Annual Exam   Annual 1. C/o intermittent h/a 8/10 frontal band like h/a tries otc tylenol/advil sometimes help and not and has to lie down with h/a no h/o migraines does not drink caffeine. No eye exam in a while  2. Hand eczema, seb derm scalp and cyst mid chest  Review of Systems  Constitutional:  Negative for weight loss.  HENT:  Negative for hearing loss.   Eyes:  Negative for blurred vision.  Respiratory:  Negative for shortness of breath.   Cardiovascular:  Negative for chest pain.  Gastrointestinal:  Negative for abdominal pain and blood in stool.  Genitourinary:  Negative for dysuria.  Musculoskeletal:  Negative for falls and joint pain.  Skin:  Negative for rash.  Neurological:  Negative for headaches.  Psychiatric/Behavioral:  Negative for depression.   Past Medical History:  Diagnosis Date   Iron deficiency    in pregnancy    Past Surgical History:  Procedure Laterality Date   WISDOM TOOTH EXTRACTION     Family History  Problem Relation Age of Onset   Diabetes Father    Diabetes Maternal Grandfather    Diabetes Paternal Grandmother    Iron deficiency Mother    Diabetes Maternal Uncle    Diabetes Maternal Grandmother    Diabetes Other        GGM   Colon cancer Other        m. great uncle    Social History   Socioeconomic History   Marital status: Single    Spouse name: Not on file   Number of children: Not on file   Years of education: Not on file   Highest education level: Not on file  Occupational History   Not on file  Tobacco Use   Smoking status: Never   Smokeless tobacco: Never  Substance and Sexual Activity   Alcohol use: Yes    Comment: occas.   Drug use: Never   Sexual activity: Yes    Partners: Male  Other Topics Concern   Not on file  Social History Narrative   Mom Gerry Heaphy DPR (also Dr. [employer] patient)   We can leave messages to patients phone if we call and she does not answer. 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 Hand dermatitis   rizatriptan (MAXALT) 5 MG tablet Take 1 tablet (5 mg total) by mouth as needed for migraine. May repeat in 2 hours if needed max dose 2 pills in 24 hours   No Known Allergies No results found for this or any previous visit (from the past 2160 hour(s)). Objective  Body mass index is 34.55 kg/m. Wt Readings from Last 3 Encounters:  05/04/21 201 lb 6.4 oz (91.4 kg)  04/22/20 211 lb 3.2 oz (95.8 kg)  01/08/20 207 lb 12.8 oz (94.3 kg)   Temp Readings from Last 3 Encounters:  05/04/21 (!) 97.2 F (36.2 C) (Temporal)  04/22/20 99 F (  37.2 C) (Oral)  01/08/20 98.7 F (37.1 C)   BP Readings from Last 3 Encounters:  05/04/21 122/68  04/22/20 126/82  01/08/20 122/68   Pulse Readings from Last 3 Encounters:  05/04/21 87  04/22/20 89  01/08/20 77    Physical Exam Vitals and nursing note reviewed.  Constitutional:      Appearance: Normal appearance. She is well-developed and well-groomed.  HENT:     Head: Normocephalic and atraumatic.  Eyes:     Conjunctiva/sclera: Conjunctivae normal.     Pupils: Pupils are equal, round, and reactive to light.  Cardiovascular:     Rate and Rhythm: Normal rate and regular rhythm.     Heart sounds: Normal heart sounds. No murmur heard. Pulmonary:     Effort: Pulmonary effort is normal.     Breath sounds: Normal breath sounds.  Abdominal:     General: Abdomen is flat. Bowel sounds are normal.     Tenderness: There is no abdominal tenderness.   Musculoskeletal:        General: No tenderness.  Skin:    General: Skin is warm and dry.       Neurological:     General: No focal deficit present.     Mental Status: She is alert and oriented to person, place, and time. Mental status is at baseline.     Cranial Nerves: Cranial nerves 2-12 are intact.     Gait: Gait is intact.  Psychiatric:        Attention and Perception: Attention and perception normal.        Mood and Affect: Mood and affect normal.        Speech: Speech normal.        Behavior: Behavior normal. Behavior is cooperative.        Thought Content: Thought content normal.        Cognition and Memory: Cognition and memory normal.        Judgment: Judgment normal.    Assessment  Plan  Annual physical exam - Plan: Comprehensive metabolic panel, CBC with Differential/Platelet,Lipid panel, TSH, UA, 25 vitamin D  Iron deficiency - Plan: CBC with Differential/Platelet, IBC + Ferritin  Thyroid disorder screening - Plan: TSH  Vitamin D deficiency - Plan: Vitamin D (25 hydroxy), Cholecalciferol 1.25 MG (50000 UT) capsule  Tension headache - Plan: rizatriptan (MAXALT) 5 MG tablet prn tylenol otc nsaids  Hand eczema - Plan: clobetasol cream (TEMOVATE) 0.05 %, Ambulatory referral to Dermatology Seborrheic dermatitis - Plan: Ambulatory referral to Dermatology EIC (epidermal inclusion cyst) chest - Plan: Ambulatory referral to Dermatology  Hyperglycemia - Plan: Hemoglobin A1c   [facility] sch fasting labs asap  Flu shot declines  Pfizer 2/2 consider booster  Tdap had in 8 or 12/2018 with ob/gyn  HPV  Vaccine check with peds records  OB/GYN Dr. Mora [facility] sch 10/2021 ROI prior pap  Pap need to get copy from ob/gyn above rec healthy diet and exercise and responsible decisions  Vaccines check with [facility] in [place] Dr. Azucena Kuba.   Provider: Dr. French Ana McLean-Scocuzza-Internal Medicine

## 2021-05-04 NOTE — Patient Instructions (Addendum)
Magnesium 250 mg daily  Water 55-64 ounces daily  Consider vision test [facility]  Dr Moshe Cipro Address: [address], [place], [place] [address] Hours:  Open ? Closes 5PM Products and Services: [email or web address] Phone: [phone]  Rizatriptan Tablets What is this medication? RIZATRIPTAN (rye za TRIP tan) treats migraines. It works by blocking pain signals and narrowing blood vessels in the brain. It belongs to a group of medications called triptans. It is not used to prevent migraines. This medicine may be used for other purposes; ask your health care provider or pharmacist if you have questions. COMMON BRAND NAME(S): Maxalt What should I tell my care team before I take this medication? They need to know if you have any of these conditions: Cigarette smoker Circulation problems in fingers and toes Diabetes Heart disease High blood pressure High cholesterol History of irregular heartbeat History of stroke Kidney disease Liver disease Stomach or intestine problems An unusual or allergic reaction to rizatriptan, other medications, foods, dyes, or preservatives Pregnant or trying to get pregnant Breast-feeding How should I use this medication? Take this medication by mouth with a glass of water. Follow the directions on the prescription label. Do not take it more often than directed. Talk to your care team regarding the use of this medication in children. While this medication may be prescribed for children as young as 6 years for selected conditions, precautions do apply. Overdosage: If you think you have taken too much of this medicine contact a poison control center or emergency room at once. NOTE: This medicine is only for you. Do not share this medicine with others. What if I miss a dose? This does not apply. This medication is not for regular use. What may interact with this medication? Do not take this medication with any of the following: Certain medications  for migraine headache like almotriptan, eletriptan, frovatriptan, naratriptan, rizatriptan, sumatriptan, zolmitriptan Ergot alkaloids like dihydroergotamine, ergonovine, ergotamine, methylergonovine MAOIs like Carbex, Eldepryl, Marplan, Nardil, and Parnate This medication may also interact with the following: Certain medications for depression, anxiety, or psychotic disorders Propranolol This list may not describe all possible interactions. Give your health care provider a list of all the medicines, herbs, non-prescription drugs, or dietary supplements you use. Also tell them if you smoke, drink alcohol, or use illegal drugs. Some items may interact with your medicine. What should I watch for while using this medication? Visit your care team for regular checks on your progress. Tell your care team if your symptoms do not start to get better or if they get worse. You may get drowsy or dizzy. Do not drive, use machinery, or do anything that needs mental alertness until you know how this medication affects you. Do not stand up or sit up quickly, especially if you are an older patient. This reduces the risk of dizzy or fainting spells. Alcohol may interfere with the effect of this medication. Your mouth may get dry. Chewing sugarless gum or sucking hard candy and drinking plenty of water may help. Contact your care team if the problem does not go away or is severe. If you take migraine medications for 10 or more days a month, your migraines may get worse. Keep a diary of headache days and medication use. Contact your care team if your migraine attacks occur more frequently. What side effects may I notice from receiving this medication? Side effects that you should report to your care team as soon as possible: Allergic reactions--skin rash, itching, hives, swelling of the  face, lips, tongue, or throat Burning, pain, tingling, or color changes in the legs or feet Heart attack--pain or tightness in the  chest, shoulders, arms, or jaw, nausea, shortness of breath, cold or clammy skin, feeling faint or lightheaded Heart rhythm changes--fast or irregular heartbeat, dizziness, feeling faint or lightheaded, chest pain, trouble breathing Increase in blood pressure Irritability, confusion, fast or irregular heartbeat, muscle stiffness, twitching muscles, sweating, high fever, seizure, chills, vomiting, diarrhea, which may be signs of serotonin syndrome Raynaud's--cool, numb, or painful fingers or toes that may change color from pale, to blue, to red Seizures Stroke--sudden numbness or weakness of the face, arm, or leg, trouble speaking, confusion, trouble walking, loss of balance or coordination, dizziness, severe headache, change in vision Sudden or severe stomach pain, nausea, vomiting, fever, or bloody diarrhea Vision loss Side effects that usually do not require medical attention (report to your care team if they continue or are bothersome): Dizziness General discomfort or fatigue This list may not describe all possible side effects. Call your doctor for medical advice about side effects. You may report side effects to FDA at 1-800-FDA-1088. Where should I keep my medication? Keep out of the reach of children and pets. Store at room temperature between 15 and 30 degrees C (59 and 86 degrees F). Keep container tightly closed. Throw away any unused medication after the expiration date. NOTE: This sheet is a summary. It may not cover all possible information. If you have questions about this medicine, talk to your doctor, pharmacist, or health care provider.  2022 Elsevier/Gold Standard (2020-05-26 00:00:00)  Tension Headache, Adult A tension headache is a feeling of pain, pressure, or aching over the front and sides of the head. The pain can be dull, or it can feel tight. There are two types of tension headache: Episodic tension headache. This is when the headaches happen fewer than 15 days a  month. Chronic tension headache. This is when the headaches happen more than 15 days a month during a 59-month period. A tension headache can last from 30 minutes to several days. It is the most common kind of headache. Tension headaches are not normally associated with nausea or vomiting, and they do not get worse with physical activity. What are the causes? The exact cause of this condition is not known. Tension headaches are often triggered by stress, anxiety, or depression. Other triggers may include: Alcohol. Too much caffeine or caffeine withdrawal. Respiratory infections, such as colds, flu, or sinus infections. Dental problems or teeth clenching. Fatigue. Holding your head and neck in the same position for a long period of time, such as while using a computer. Smoking. Arthritis of the neck. What are the signs or symptoms? Symptoms of this condition include: A feeling of pressure or tightness around the head. Dull, aching head pain. Pain over the front and sides of the head. Tenderness in the muscles of the head, neck, and shoulders. How is this diagnosed? This condition may be diagnosed based on your symptoms, your medical history, and a physical exam. If your symptoms are severe or unusual, you may have imaging tests, such as a CT scan or an MRI of your head. Your vision may also be checked. How is this treated? This condition may be treated with lifestyle changes and with medicines that help relieve symptoms. Follow these instructions at home: Managing pain Take over-the-counter and prescription medicines only as told by your health care provider. When you have a headache, lie down in a dark, quiet  room. If directed, put ice on your head and neck. To do this: Put ice in a plastic bag. Place a towel between your skin and the bag. Leave the ice on for 20 minutes, 2-3 times a day. Remove the ice if your skin turns bright red. This is very important. If you cannot feel pain,  heat, or cold, you have a greater risk of damage to the area. If directed, apply heat to the back of your neck as often as told by your health care provider. Use the heat source that your health care provider recommends, such as a moist heat pack or a heating pad. Place a towel between your skin and the heat source. Leave the heat on for 20-30 minutes. Remove the heat if your skin turns bright red. This is especially important if you are unable to feel pain, heat, or cold. You have a greater risk of getting burned. Eating and drinking Eat meals on a regular schedule. If you drink alcohol: Limit how much you have to: 0-1 drink a day for women who are not pregnant. 0-2 drinks a day for men. Know how much alcohol is in your drink. In the [place] one drink equals one 12 oz bottle of beer (355 mL), one 5 oz glass of wine (148 mL), or one 1 oz glass of hard liquor (44 mL). Drink enough fluid to keep your urine pale yellow. Decrease your caffeine intake, or stop using caffeine. Lifestyle Get 7-9 hours of sleep each night, or get the amount of sleep recommended by your health care provider. At bedtime, remove computers, phones, and tablets from your room. Find ways to manage your stress. This may include: Exercise. Deep breathing exercises. Yoga. Listening to music. Positive mental imagery. Try to sit up straight and avoid tensing your muscles. Do not use any products that contain nicotine or tobacco. These include cigarettes, chewing tobacco, and vaping devices, such as e-cigarettes. If you need help quitting, ask your health care provider. General instructions  Avoid any headache triggers. Keep a journal to help find out what may trigger your headaches. For example, write down: What you eat and drink. How much sleep you get. Any change to your diet or medicines. Keep all follow-up visits. This is important. Contact a health care provider if: Your headache does not get better. Your headache  comes back. You are sensitive to sounds, light, or smells because of a headache. You have nausea or you vomit. Your stomach hurts. Get help right away if: You suddenly develop a severe headache, along with any of the following: A stiff neck. Nausea and vomiting. Confusion. Weakness in one part or one side of your body. Double vision or loss of vision. Shortness of breath. Rash. Unusual sleepiness. Fever or chills. Trouble speaking. Pain in your eye or ear. Trouble walking or balancing. Feeling faint or passing out. Summary A tension headache is a feeling of pain, pressure, or aching over the front and sides of the head. A tension headache can last from 30 minutes to several days. It is the most common kind of headache. This condition may be diagnosed based on your symptoms, your medical history, and a physical exam. This condition may be treated with lifestyle changes and with medicines that help relieve symptoms. This information is not intended to replace advice given to you by your health care provider. Make sure you discuss any questions you have with your health care provider. Document Revised: 01/15/2020 Document Reviewed: 01/15/2020 Elsevier Patient Education  2022 Elsevier Inc.  General Headache Without Cause A headache is pain or discomfort felt around the head or neck area. There are many causes and types of headaches. A few common types include: Tension headaches. Migraine headaches. Cluster headaches. Chronic daily headaches. Sometimes, the specific cause of a headache may not be found. Follow these instructions at home: Watch your condition for any changes. Let your health care provider know about them. Take these steps to help with your condition: Managing pain   Take over-the-counter and prescription medicines only as told by your health care provider. Treatment may include medicines for pain that are taken by mouth or applied to the skin. Lie down in a dark,  quiet room when you have a headache. Keep lights dim if bright lights bother you or make your headaches worse. If directed, put ice on your head and neck area: Put ice in a plastic bag. Place a towel between your skin and the bag. Leave the ice on for 20 minutes, 2-3 times per day. Remove the ice if your skin turns bright red. This is very important. If you cannot feel pain, heat, or cold, you have a greater risk of damage to the area. If directed, apply heat to the affected area. Use the heat source that your health care provider recommends, such as a moist heat pack or a heating pad. Place a towel between your skin and the heat source. Leave the heat on for 20-30 minutes. Remove the heat if your skin turns bright red. This is especially important if you are unable to feel pain, heat, or cold. You have a greater risk of getting burned. Eating and drinking Eat meals on a regular schedule. If you drink alcohol: Limit how much you have to: 0-1 drink a day for women who are not pregnant. 0-2 drinks a day for men. Know how much alcohol is in a drink. In the [place] one drink equals one 12 oz bottle of beer (355 mL), one 5 oz glass of wine (148 mL), or one 1 oz glass of hard liquor (44 mL). Stop drinking caffeine, or decrease the amount of caffeine you drink. Drink enough fluid to keep your urine pale yellow. General instructions  Keep a headache journal to help find out what may trigger your headaches. For example, write down: What you eat and drink. How much sleep you get. Any change to your diet or medicines. Try massage or other relaxation techniques. Limit stress. Sit up straight, and do not tense your muscles. Do not use any products that contain nicotine or tobacco. These products include cigarettes, chewing tobacco, and vaping devices, such as e-cigarettes. If you need help quitting, ask your health care provider. Exercise regularly as told by your health care provider. Sleep on a  regular schedule. Get 7-9 hours of sleep each night, or the amount recommended by your health care provider. Keep all follow-up visits. This is important. Contact a health care provider if: Medicine does not help your symptoms. You have a headache that is different from your usual headache. You have nausea or you vomit. You have a fever. Get help right away if: Your headache: Becomes severe quickly. Gets worse after moderate to intense physical activity. You have any of these symptoms: Repeated vomiting. Pain or stiffness in your neck. Changes to your vision. Pain in an eye or ear. Problems with speech. Muscular weakness or loss of muscle control. Loss of balance or coordination. You feel faint or pass out. You have  confusion. You have a seizure. These symptoms may represent a serious problem that is an emergency. Do not wait to see if the symptoms will go away. Get medical help right away. Call your local emergency services (911 in the [place] Do not drive yourself to the hospital. Summary A headache is pain or discomfort felt around the head or neck area. There are many causes and types of headaches. In some cases, the cause may not be found. Keep a headache journal to help find out what may trigger your headaches. Watch your condition for any changes. Let your health care provider know about them. Contact a health care provider if you have a headache that is different from the usual headache, or if your symptoms are not helped by medicine. Get help right away if your headache becomes severe, you vomit, you have a loss of vision, you lose your balance, or you have a seizure. This information is not intended to replace advice given to you by your health care provider. Make sure you discuss any questions you have with your health care provider. Document Revised: 09/15/2020 Document Reviewed: 09/15/2020 Elsevier Patient Education  2022 [employer]

## 2021-05-13 ENCOUNTER — Other Ambulatory Visit: Payer: Commercial Managed Care - PPO

## 2021-05-16 ENCOUNTER — Other Ambulatory Visit (INDEPENDENT_AMBULATORY_CARE_PROVIDER_SITE_OTHER): Payer: Commercial Managed Care - PPO

## 2021-05-16 ENCOUNTER — Other Ambulatory Visit: Payer: Self-pay

## 2021-05-16 ENCOUNTER — Encounter: Payer: Self-pay | Admitting: Internal Medicine

## 2021-05-16 DIAGNOSIS — E785 Hyperlipidemia, unspecified: Secondary | ICD-10-CM | POA: Diagnosis not present

## 2021-05-16 DIAGNOSIS — E559 Vitamin D deficiency, unspecified: Secondary | ICD-10-CM

## 2021-05-16 DIAGNOSIS — R739 Hyperglycemia, unspecified: Secondary | ICD-10-CM

## 2021-05-16 DIAGNOSIS — E611 Iron deficiency: Secondary | ICD-10-CM

## 2021-05-16 DIAGNOSIS — Z0000 Encounter for general adult medical examination without abnormal findings: Secondary | ICD-10-CM | POA: Diagnosis not present

## 2021-05-16 DIAGNOSIS — Z1329 Encounter for screening for other suspected endocrine disorder: Secondary | ICD-10-CM | POA: Diagnosis not present

## 2021-05-16 DIAGNOSIS — R7303 Prediabetes: Secondary | ICD-10-CM | POA: Insufficient documentation

## 2021-05-16 DIAGNOSIS — D509 Iron deficiency anemia, unspecified: Secondary | ICD-10-CM | POA: Insufficient documentation

## 2021-05-16 DIAGNOSIS — Z1389 Encounter for screening for other disorder: Secondary | ICD-10-CM

## 2021-05-16 LAB — CBC WITH DIFFERENTIAL/PLATELET
Basophils Absolute: 0 10*3/uL (ref 0.0–0.1)
Basophils Relative: 0.7 % (ref 0.0–3.0)
Eosinophils Absolute: 0.1 10*3/uL (ref 0.0–0.7)
Eosinophils Relative: 1.2 % (ref 0.0–5.0)
HCT: 31.1 % — ABNORMAL LOW (ref 36.0–46.0)
Hemoglobin: 9.5 g/dL — ABNORMAL LOW (ref 12.0–15.0)
Lymphocytes Relative: 38.4 % (ref 12.0–46.0)
Lymphs Abs: 2.4 10*3/uL (ref 0.7–4.0)
MCHC: 30.7 g/dL (ref 30.0–36.0)
MCV: 70.9 fl — ABNORMAL LOW (ref 78.0–100.0)
Monocytes Absolute: 0.6 10*3/uL (ref 0.1–1.0)
Monocytes Relative: 9 % (ref 3.0–12.0)
Neutro Abs: 3.2 10*3/uL (ref 1.4–7.7)
Neutrophils Relative %: 50.7 % (ref 43.0–77.0)
Platelets: 353 10*3/uL (ref 150.0–400.0)
RBC: 4.38 Mil/uL (ref 3.87–5.11)
RDW: 18 % — ABNORMAL HIGH (ref 11.5–15.5)
WBC: 6.4 10*3/uL (ref 4.0–10.5)

## 2021-05-16 LAB — COMPREHENSIVE METABOLIC PANEL
ALT: 9 U/L (ref 0–35)
AST: 13 U/L (ref 0–37)
Albumin: 4 g/dL (ref 3.5–5.2)
Alkaline Phosphatase: 65 U/L (ref 39–117)
BUN: 11 mg/dL (ref 6–23)
CO2: 26 mEq/L (ref 19–32)
Calcium: 9 mg/dL (ref 8.4–10.5)
Chloride: 106 mEq/L (ref 96–112)
Creatinine, Ser: 0.59 mg/dL (ref 0.40–1.20)
GFR: 125.02 mL/min (ref >60.00)
Glucose, Bld: 76 mg/dL (ref 70–99)
Potassium: 4 mEq/L (ref 3.5–5.1)
Sodium: 140 mEq/L (ref 135–145)
Total Bilirubin: 0.6 mg/dL (ref 0.2–1.2)
Total Protein: 7 g/dL (ref 6.0–8.3)

## 2021-05-16 LAB — IBC + FERRITIN
Ferritin: 1.9 ng/mL — ABNORMAL LOW (ref 10.0–291.0)
Iron: 20 ug/dL — ABNORMAL LOW (ref 42–145)
Saturation Ratios: 4.1 % — ABNORMAL LOW (ref 20.0–50.0)
TIBC: 487.2 ug/dL — ABNORMAL HIGH (ref 250.0–450.0)
Transferrin: 348 mg/dL (ref 212.0–360.0)

## 2021-05-16 LAB — LIPID PANEL
Cholesterol: 134 mg/dL (ref 0–200)
HDL: 43.2 mg/dL (ref >39.00)
LDL Cholesterol: 76 mg/dL (ref 0–99)
NonHDL: 90.48
Total CHOL/HDL Ratio: 3
Triglycerides: 71 mg/dL (ref 0.0–149.0)
VLDL: 14.2 mg/dL (ref 0.0–40.0)

## 2021-05-16 LAB — TSH: TSH: 1.23 u[IU]/mL (ref 0.35–5.50)

## 2021-05-16 LAB — VITAMIN D 25 HYDROXY (VIT D DEFICIENCY, FRACTURES): VITD: 22.61 ng/mL — ABNORMAL LOW (ref 30.00–100.00)

## 2021-05-16 LAB — HEMOGLOBIN A1C: Hgb A1c MFr Bld: 5.8 % (ref 4.6–6.5)

## 2021-05-16 NOTE — Addendum Note (Signed)
Addended by: Warden Fillers on: 05/16/2021 09:12 AM   Modules accepted: Orders

## 2021-05-18 ENCOUNTER — Telehealth: Payer: Self-pay | Admitting: Internal Medicine

## 2021-05-18 NOTE — Telephone Encounter (Signed)
Pt calling in regards to lab results.  °

## 2021-05-18 NOTE — Telephone Encounter (Signed)
This has been addressed see phone note.

## 2021-05-19 ENCOUNTER — Telehealth: Payer: Self-pay | Admitting: Internal Medicine

## 2021-05-19 NOTE — Telephone Encounter (Signed)
Rejection Reason - Other - We are out of network with this patients insurance. I advised her to call her PCP and request a new referral to an office that accepted her insurance. SS" Kathryn Brandt said on May 19, 2021 10:05 AM  Msg from [facility]

## 2021-05-19 NOTE — Telephone Encounter (Signed)
Dermatology not in network did she want to go somewhere else or see who her insurance will cover and let us know?

## 2021-05-20 NOTE — Telephone Encounter (Signed)
Patient informed to call our office for new referral by Dermatology office. Sent message to Patient via mychart.

## 2021-06-21 DIAGNOSIS — Z3042 Encounter for surveillance of injectable contraceptive: Secondary | ICD-10-CM | POA: Diagnosis not present

## 2021-06-21 DIAGNOSIS — N920 Excessive and frequent menstruation with regular cycle: Secondary | ICD-10-CM | POA: Diagnosis not present

## 2021-06-21 DIAGNOSIS — N926 Irregular menstruation, unspecified: Secondary | ICD-10-CM | POA: Diagnosis not present

## 2021-08-09 ENCOUNTER — Encounter: Payer: Self-pay | Admitting: Internal Medicine

## 2021-08-09 ENCOUNTER — Ambulatory Visit (INDEPENDENT_AMBULATORY_CARE_PROVIDER_SITE_OTHER): Payer: Commercial Managed Care - PPO

## 2021-08-09 ENCOUNTER — Ambulatory Visit (INDEPENDENT_AMBULATORY_CARE_PROVIDER_SITE_OTHER): Payer: Commercial Managed Care - PPO | Admitting: Internal Medicine

## 2021-08-09 VITALS — BP 120/80 | HR 70 | Temp 98.0°F | Resp 14 | Ht 64.02 in | Wt 202.8 lb

## 2021-08-09 DIAGNOSIS — M25562 Pain in left knee: Secondary | ICD-10-CM

## 2021-08-09 MED ORDER — MELOXICAM 7.5 MG PO TABS: 7.5000 mg | ORAL_TABLET | Freq: Two times a day (BID) | ORAL | 1 refills | Status: DC | PRN

## 2021-08-09 NOTE — Patient Instructions (Addendum)
Tylenol with mobic  ?Knee brace  ?Voltaren gel 4x per day or Aspercream with lidocaine  ?Consider ortho in the future if not better  ?Try ice and heat  ? ?Like butter moisterizer for eczema at [employer]  ? ?Knee Exercises ?Ask your health care provider which exercises are safe for you. Do exercises exactly as told by your health care provider and adjust them as directed. It is normal to feel mild stretching, pulling, tightness, or discomfort as you do these exercises. Stop right away if you feel sudden pain or your pain gets worse. Do not begin these exercises until told by your health care provider. ?Stretching and range-of-motion exercises ?These exercises warm up your muscles and joints and improve the movement and flexibility of your knee. These exercises also help to relieve pain and swelling. ?Knee extension, prone ? ?Lie on your abdomen (prone position) on a bed. ?Place your left / right knee just beyond the edge of the surface so your knee is not on the bed. You can put a towel under your left / right thigh just above your kneecap for comfort. ?Relax your leg muscles and allow gravity to straighten your knee (extension). You should feel a stretch behind your left / right knee. ?Hold this position for __________ seconds. ?Scoot up so your knee is supported between repetitions. ?Repeat __________ times. Complete this exercise __________ times a day. ?Knee flexion, active ? ?Lie on your back with both legs straight. If this causes back discomfort, bend your left / right knee so your foot is flat on the floor. ?Slowly slide your left / right heel back toward your buttocks. Stop when you feel a gentle stretch in the front of your knee or thigh (flexion). ?Hold this position for __________ seconds. ?Slowly slide your left / right heel back to the starting position. ?Repeat __________ times. Complete this exercise __________ times a day. ?Quadriceps stretch, prone ? ?Lie on your abdomen on a firm surface, such as a  bed or padded floor. ?Bend your left / right knee and hold your ankle. If you cannot reach your ankle or pant leg, loop a belt around your foot and grab the belt instead. ?Gently pull your heel toward your buttocks. Your knee should not slide out to the side. You should feel a stretch in the front of your thigh and knee (quadriceps). ?Hold this position for __________ seconds. ?Repeat __________ times. Complete this exercise __________ times a day. ?Hamstring, supine ? ?Lie on your back (supine position). ?Loop a belt or towel over the ball of your left / right foot. The ball of your foot is on the walking surface, right under your toes. ?Straighten your left / right knee and slowly pull on the belt to raise your leg until you feel a gentle stretch behind your knee (hamstring). ?Do not let your knee bend while you do this. ?Keep your other leg flat on the floor. ?Hold this position for __________ seconds. ?Repeat __________ times. Complete this exercise __________ times a day. ?Strengthening exercises ?These exercises build strength and endurance in your knee. Endurance is the ability to use your muscles for a long time, even after they get tired. ?Quadriceps, isometric ?This exercise strengthens the muscles in front of your thigh (quadriceps) without moving your knee joint (isometric). ?Lie on your back with your left / right leg extended and your other knee bent. Put a rolled towel or small pillow under your knee if told by your health care provider. ?Slowly tense  the muscles in the front of your left / right thigh. You should see your kneecap slide up toward your hip or see increased dimpling just above the knee. This motion will push the back of the knee toward the floor. ?For __________ seconds, hold the muscle as tight as you can without increasing your pain. ?Relax the muscles slowly and completely. ?Repeat __________ times. Complete this exercise __________ times a day. ?Straight leg raises ?This exercise  strengthens the muscles in front of your thigh (quadriceps) and the muscles that move your hips (hip flexors). ?Lie on your back with your left / right leg extended and your other knee bent. ?Tense the muscles in the front of your left / right thigh. You should see your kneecap slide up or see increased dimpling just above the knee. Your thigh may even shake a bit. ?Keep these muscles tight as you raise your leg 4-6 inches (10-15 cm) off the floor. Do not let your knee bend. ?Hold this position for __________ seconds. ?Keep these muscles tense as you lower your leg. ?Relax your muscles slowly and completely after each repetition. ?Repeat __________ times. Complete this exercise __________ times a day. ?Hamstring, isometric ? ?Lie on your back on a firm surface. ?Bend your left / right knee about __________ degrees. ?Dig your left / right heel into the surface as if you are trying to pull it toward your buttocks. Tighten the muscles in the back of your thighs (hamstring) to "dig" as hard as you can without increasing any pain. ?Hold this position for __________ seconds. ?Release the tension gradually and allow your muscles to relax completely for __________ seconds after each repetition. ?Repeat __________ times. Complete this exercise __________ times a day. ?Hamstring curls ?If told by your health care provider, do this exercise while wearing ankle weights. Begin with __________lb / kg weights. Then increase the weight by 1 lb (0.5 kg) increments. Do not wear ankle weights that are more than __________lb / kg. ?Lie on your abdomen with your legs straight. ?Bend your left / right knee as far as you can without feeling pain. Keep your hips flat against the floor. ?Hold this position for __________ seconds. ?Slowly lower your leg to the starting position. ?Repeat __________ times. Complete this exercise __________ times a day. ?Squats ?This exercise strengthens the muscles in front of your thigh and knee  (quadriceps). ?Stand in front of a table, with your feet and knees pointing straight ahead. You may rest your hands on the table for balance but not for support. ?Slowly bend your knees and lower your hips like you are going to sit in a chair. ?Keep your weight over your heels, not over your toes. ?Keep your lower legs upright so they are parallel with the table legs. ?Do not let your hips go lower than your knees. ?Do not bend lower than told by your health care provider. ?If your knee pain increases, do not bend as low. ?Hold the squat position for __________ seconds. ?Slowly push with your legs to return to standing. Do not use your hands to pull yourself to standing. ?Repeat __________ times. Complete this exercise __________ times a day. ?Wall slides ?This exercise strengthens the muscles in front of your thigh and knee (quadriceps). ?Lean your back against a smooth wall or door, and walk your feet out 18-24 inches (46-61 cm) from it. ?Place your feet hip-width apart. ?Slowly slide down the wall or door until your knees bend __________ degrees. Keep your knees over  your heels, not over your toes. Keep your knees in line with your hips. ?Hold this position for __________ seconds. ?Repeat __________ times. Complete this exercise __________ times a day. ?Straight leg raises, side-lying ?This exercise strengthens the muscles that rotate the leg at the hip and move it away from your body (hip abductors). ?Lie on your side with your left / right leg in the top position. Lie so your head, shoulder, knee, and hip line up. You may bend your bottom knee to help you keep your balance. ?Roll your hips slightly forward so your hips are stacked directly over each other and your left / right knee is facing forward. ?Leading with your heel, lift your top leg 4-6 inches (10-15 cm). You should feel the muscles in your outer hip lifting. ?Do not let your foot drift forward. ?Do not let your knee roll toward the ceiling. ?Hold  this position for __________ seconds. ?Slowly return your leg to the starting position. ?Let your muscles relax completely after each repetition. ?Repeat __________ times. Complete this exercise __________ times a day.

## 2021-08-09 NOTE — Progress Notes (Signed)
Chief Complaint  ?Patient presents with  ? Acute Visit  ?  Pt c/o L knee pain ongoing for awhile. Denies any falls or injuries, pt thinks it may be coming from her lack of vitamins or weight. Pain was worse yesterday. Pt started Depoprovera first injection was around end of february.   ? ?F/u  ?1. Left knee pain worse yesterday 2-8/10 front of knee working out 1 hours but skipping days and using exercise bicycle at the gym nothing tried no falls/injuries  ? ? ?Review of Systems  ?Musculoskeletal:  Positive for joint pain.  ?Past Medical History:  ?Diagnosis Date  ? Iron deficiency   ? in pregnancy   ? ?Past Surgical History:  ?Procedure Laterality Date  ? WISDOM TOOTH EXTRACTION    ? ?Family History  ?Problem Relation Age of Onset  ? Diabetes Father   ? Diabetes Maternal Grandfather   ? Diabetes Paternal Grandmother   ? Iron deficiency Mother   ? Diabetes Maternal Uncle   ? Diabetes Maternal Grandmother   ? Diabetes Other   ?     GGM  ? Colon cancer Other   ?     m. great uncle   ? ?Social History  ? ?Socioeconomic History  ? Marital status: Single  ?  Spouse name: Not on file  ? Number of children: Not on file  ? Years of education: Not on file  ? Highest education level: Not on file  ?Occupational History  ? Not on file  ?Tobacco Use  ? Smoking status: Never  ? Smokeless tobacco: Never  ?Substance and Sexual Activity  ? Alcohol use: Yes  ?  Comment: occas.  ? Drug use: Never  ? Sexual activity: Yes  ?  Partners: Male  ?Other Topics Concern  ? Not on file  ?Social History Narrative  ? Mom Lamont Dowdy DPR (also Dr. [employer] patient)  ? We can leave messages to patients phone if we call and she does not answer. Pt refers we try her 1st and mom as last resort   ? 1 son   ? 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 ?Social Determinants of Health  ? ?Financial Resource Strain: Not on file  ?Food Insecurity: Not on file  ?Transportation Needs: Not on file  ?Physical Activity: Not on file  ?Stress: Not on file  ?Social  Connections: Not on file  ?Intimate Partner Violence: Not on file  ? ?Current Meds  ?Medication Sig  ? acetaminophen (TYLENOL) 500 MG tablet Take 500 mg by mouth every 6 (six) hours as needed for mild pain or headache.  ? Cholecalciferol 1.25 MG (50000 UT) capsule Take 1 capsule (50,000 Units total) by mouth once a week. D3  ? clobetasol cream (TEMOVATE) 0.05 % Apply 1 application topically 2 (two) times daily. Hand dermatitis  ? medroxyPROGESTERone Acetate 150 MG/ML SUSY SMARTSIG:1 Milliliter(s) IM Every 3 Months  ? meloxicam (MOBIC) 7.5 MG tablet Take 1 tablet (7.5 mg total) by mouth 2 (two) times daily as needed for pain.  ? ?No Known Allergies ?Recent Results (from the past 2160 hour(s))  ?Hemoglobin A1c     Status: None  ? Collection Time: 05/16/21  8:42 AM  ?Result Value Ref Range  ? Hgb A1c MFr Bld 5.8 4.6 - 6.5 %  ?  Comment: Glycemic Control Guidelines for People with Diabetes:Non Diabetic:  <6%Goal of Therapy: <7%Additional Action Suggested:  >8%   ?IBC + Ferritin     Status: Abnormal  ?  Collection Time: 05/16/21  8:42 AM  ?Result Value Ref Range  ? Iron 20 (L) 42 - 145 ug/dL  ? Transferrin 348.0 212.0 - 360.0 mg/dL  ? Saturation Ratios 4.1 (L) 20.0 - 50.0 %  ? Ferritin 1.9 (L) 10.0 - 291.0 ng/mL  ? TIBC 487.2 (H) 250.0 - 450.0 mcg/dL  ?Vitamin D (25 hydroxy)     Status: Abnormal  ? Collection Time: 05/16/21  8:42 AM  ?Result Value Ref Range  ? VITD 22.61 (L) 30.00 - 100.00 ng/mL  ?TSH     Status: None  ? Collection Time: 05/16/21  8:42 AM  ?Result Value Ref Range  ? TSH 1.23 0.35 - 5.50 uIU/mL  ?CBC with Differential/Platelet     Status: Abnormal  ? Collection Time: 05/16/21  8:42 AM  ?Result Value Ref Range  ? WBC 6.4 4.0 - 10.5 K/uL  ? RBC 4.38 3.87 - 5.11 Mil/uL  ? Hemoglobin 9.5 (L) 12.0 - 15.0 g/dL  ? HCT 31.1 (L) 36.0 - 46.0 %  ? MCV 70.9 (L) 78.0 - 100.0 fl  ? MCHC 30.7 30.0 - 36.0 g/dL  ? RDW 18.0 (H) 11.5 - 15.5 %  ? Platelets 353.0 150.0 - 400.0 K/uL  ? Neutrophils Relative % 50.7 43.0 - 77.0 %   ? Lymphocytes Relative 38.4 12.0 - 46.0 %  ? Monocytes Relative 9.0 3.0 - 12.0 %  ? Eosinophils Relative 1.2 0.0 - 5.0 %  ? Basophils Relative 0.7 0.0 - 3.0 %  ? Neutro Abs 3.2 1.4 - 7.7 K/uL  ? Lymphs Abs 2.4 0.7 - 4.0 K/uL  ? Monocytes Absolute 0.6 0.1 - 1.0 K/uL  ? Eosinophils Absolute 0.1 0.0 - 0.7 K/uL  ? Basophils Absolute 0.0 0.0 - 0.1 K/uL  ?Lipid panel     Status: None  ? Collection Time: 05/16/21  8:42 AM  ?Result Value Ref Range  ? Cholesterol 134 0 - 200 mg/dL  ?  Comment: ATP III Classification       Desirable:  < 200 mg/dL               Borderline High:  200 - 239 mg/dL          High:  > = 191240 mg/dL  ? Triglycerides 71.0 0.0 - 149.0 mg/dL  ?  Comment: Normal:  <150 mg/dLBorderline High:  150 - 199 mg/dL  ? HDL 43.20 >39.00 mg/dL  ? VLDL 14.2 0.0 - 40.0 mg/dL  ? LDL Cholesterol 76 0 - 99 mg/dL  ? Total CHOL/HDL Ratio 3   ?  Comment:                Men          Women1/2 Average Risk     3.4          3.3Average Risk          5.0          4.42X Average Risk          9.6          7.13X Average Risk          15.0          11.0                      ? NonHDL 90.48   ?  Comment: NOTE:  Non-HDL goal should be 30 mg/dL higher than patient's LDL goal (i.e. LDL goal of < 70 mg/dL, would have  non-HDL goal of < 100 mg/dL)  ?Comprehensive metabolic panel     Status: None  ? Collection Time: 05/16/21  8:42 AM  ?Result Value Ref Range  ? Sodium 140 135 - 145 mEq/L  ? Potassium 4.0 3.5 - 5.1 mEq/L  ? Chloride 106 96 - 112 mEq/L  ? CO2 26 19 - 32 mEq/L  ? Glucose, Bld 76 70 - 99 mg/dL  ? BUN 11 6 - 23 mg/dL  ? Creatinine, Ser 0.59 0.40 - 1.20 mg/dL  ? Total Bilirubin 0.6 0.2 - 1.2 mg/dL  ? Alkaline Phosphatase 65 39 - 117 U/L  ? AST 13 0 - 37 U/L  ? ALT 9 0 - 35 U/L  ? Total Protein 7.0 6.0 - 8.3 g/dL  ? Albumin 4.0 3.5 - 5.2 g/dL  ? GFR 125.02 >60.00 mL/min  ?  Comment: Calculated using the CKD-EPI Creatinine Equation (2021)  ? Calcium 9.0 8.4 - 10.5 mg/dL  ? ?Objective  ?Body mass index is 34.79 kg/m?. ?Wt Readings  from Last 3 Encounters:  ?08/09/21 202 lb 12.8 oz (92 kg)  ?05/04/21 201 lb 6.4 oz (91.4 kg)  ?04/22/20 211 lb 3.2 oz (95.8 kg)  ? ?Temp Readings from Last 3 Encounters:  ?08/09/21 98 ?F (36.7 ?C) (Oral)  ?05/04/21 (!) 97.2 ?F (36.2 ?C) (Temporal)  ?04/22/20 99 ?F (37.2 ?C) (Oral)  ? ?BP Readings from Last 3 Encounters:  ?08/09/21 120/80  ?05/04/21 122/68  ?04/22/20 126/82  ? ?Pulse Readings from Last 3 Encounters:  ?08/09/21 70  ?05/04/21 87  ?04/22/20 89  ? ? ?Physical Exam ?Vitals and nursing note reviewed.  ?Constitutional:   ?   Appearance: Normal appearance. She is well-developed and well-groomed.  ?HENT:  ?   Head: Normocephalic and atraumatic.  ?Eyes:  ?   Conjunctiva/sclera: Conjunctivae normal.  ?   Pupils: Pupils are equal, round, and reactive to light.  ?Cardiovascular:  ?   Rate and Rhythm: Normal rate and regular rhythm.  ?   Heart sounds: Normal heart sounds. No murmur heard. ?Pulmonary:  ?   Effort: Pulmonary effort is normal.  ?   Breath sounds: Normal breath sounds.  ?Abdominal:  ?   General: Abdomen is flat. Bowel sounds are normal.  ?   Tenderness: There is no abdominal tenderness.  ?Musculoskeletal:  ?   Left knee: Tenderness present over the medial joint line, lateral joint line, MCL and LCL.  ?Skin: ?   General: Skin is warm and dry.  ?Neurological:  ?   General: No focal deficit present.  ?   Mental Status: She is alert and oriented to person, place, and time. Mental status is at baseline.  ?   Cranial Nerves: Cranial nerves 2-12 are intact.  ?   Sensory: Sensation is intact.  ?   Motor: Motor function is intact.  ?   Coordination: Coordination is intact.  ?   Gait: Gait is intact.  ?Psychiatric:     ?   Attention and Perception: Attention and perception normal.     ?   Mood and Affect: Mood and affect normal.     ?   Speech: Speech normal.     ?   Behavior: Behavior normal. Behavior is cooperative.     ?   Thought Content: Thought content normal.     ?   Cognition and Memory: Cognition and  memory normal.     ?   Judgment: Judgment normal.  ? ? ?Assessment  ?Plan  ?Acute  pain of left knee - Plan: DG Knee Complete 4 Views Left, meloxicam (MOBIC) 7.5 MG tablet bid prn  ?With tylenol  ?Knee bra

## 2022-03-24 LAB — HM PAP SMEAR

## 2022-05-05 ENCOUNTER — Encounter: Payer: Commercial Managed Care - PPO | Admitting: Internal Medicine

## 2022-07-07 ENCOUNTER — Encounter: Payer: Self-pay | Admitting: Family Medicine

## 2022-07-07 ENCOUNTER — Ambulatory Visit: Payer: 59 | Admitting: Family Medicine

## 2022-07-07 VITALS — BP 110/76 | HR 79 | Temp 98.9°F | Ht 63.5 in | Wt 216.4 lb

## 2022-07-07 DIAGNOSIS — M940 Chondrocostal junction syndrome [Tietze]: Secondary | ICD-10-CM | POA: Diagnosis not present

## 2022-07-07 DIAGNOSIS — G8929 Other chronic pain: Secondary | ICD-10-CM

## 2022-07-07 DIAGNOSIS — K5900 Constipation, unspecified: Secondary | ICD-10-CM

## 2022-07-07 DIAGNOSIS — F439 Reaction to severe stress, unspecified: Secondary | ICD-10-CM | POA: Diagnosis not present

## 2022-07-07 DIAGNOSIS — R519 Headache, unspecified: Secondary | ICD-10-CM | POA: Diagnosis not present

## 2022-07-07 NOTE — Patient Instructions (Addendum)
It was nice meeting you today.  If you have not had your physical feel free to schedule that at your convenience.  Do not forget to keep a food diary.  The chest discomfort that you are describing sounds like something called costochondritis.  I have provided some information about that for you to review.   Counseling resources - [email or web address] Located on [email or web address] -helps you find providers in your area.  Several options including in person or virtual visits.  [facility].  Located in office park across from [place] off [address].  Tourist information centre manager Counseling and Psychologist, clinical.  Faith based counseling.  [facility]  Kathryn Brandt, LPC Typically works in the office, but doing a hybrid schedule due to COVID-19. Phone number is [phone] for appointments.

## 2022-07-07 NOTE — Progress Notes (Signed)
Established Patient Office Visit   Subjective  Patient ID: Kathryn Brandt, female    DOB: 1996/04/23  Age: 27 y.o. MRN: [id number]  Chief Complaint  Patient presents with   Establish Care    Patient is a 27 year old female previously seen by Dr. Benjaman Pott, seen for TOC.    Headaches: Patient notes frequent headaches.  Had 3-4 this week.  Typically has pain in frontal area with sensitivity to light and sound.  Patient denies nausea and vomiting with the headaches.  At times can tell the headache may start prior to it causing pain.  GI concerns: Patient endorses increased urge to defecate.  At times the sensation causes a nauseous feeling, but not emesis.  Having soft bowels, may have 2-3 BMs per day.  Took Dulcolax last week which was helpful.  Patient endorses eating fast food most days.  Typically eats meals fast.  May not drink water all day.  Chest pain: Patient endorses a sharp midsternal chest pain, intermittent x 1 year.  Sensation occurred 2 times this week.  Typically occurs with certain movements.  May last an hour.  Reproducible with palpation.  Stress: Patient notes changes in mood, more snappy than the last few months.  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  Patient notes stress from being in school, working, being a single parent, and living with her mother.  Patient planning to move out soon.  Allergies: NKDA  Social history: Patient is currently in school at [employer] stating business management with concentration and HR.  Patient is also working for a [employer].  Patient is a single parent to a 77-year-old son.  Patient currently living at home with her mother but plans to move outside.  Patient endorses social alcohol use.  Patient denies tobacco and drug use.      ROS Negative unless stated above    Objective:     BP 110/76 (BP Location: Left Arm, Patient Position:  Sitting, Cuff Size: Large)   Pulse 79   Temp 98.9 F (37.2 C) (Oral)   Ht 5' 3.5" (1.613 m)   Wt 216 lb 6.4 oz (98.2 kg)   LMP 03/08/2022 (Approximate) Comment: on birth control  SpO2 99%   BMI 37.73 kg/m    Physical Exam Constitutional:      General: She is not in acute distress.    Appearance: Normal appearance.  HENT:     Head: Normocephalic and atraumatic.     Nose: Nose normal.     Mouth/Throat:     Mouth: Mucous membranes are moist.  Eyes:     Extraocular Movements: Extraocular movements intact.     Conjunctiva/sclera: Conjunctivae normal.     Pupils: Pupils are equal, round, and reactive to light.  Cardiovascular:     Rate and Rhythm: Normal rate and regular rhythm.     Heart sounds: Normal heart sounds. No murmur heard.    No gallop.  Pulmonary:     Effort: Pulmonary effort is normal. No respiratory distress.     Breath sounds: Normal breath sounds. No wheezing, rhonchi or rales.  Skin:    General: Skin is warm and dry.  Neurological:     Mental Status: She is alert and oriented to person, place, and time.      No results found for any visits on 07/07/22.    Assessment & Plan:  Chronic nonintractable headache, unspecified  headache type -Likely stress related -Dehydration also likely contributing -Discussed headache prevention including decreasing caffeine intake, increasing hydration, getting plenty of rest, decreasing stress -Self-care encouraged -Use NSAIDs sparingly as may cause rebound headaches -Headache diary  Constipation, unspecified constipation type -GI symptoms likely 2/2 diet.   -Patient advised to decrease fast food intake, increase water, slow down when eating meals -Food diary -For continued or worsening symptoms referral to GI  Costochondritis -Tylenol and NSAIDs as needed, heat, topical analgesics, stretching  Stress -Discussed the importance of self-care -Given BH information.  Encouraged to schedule appointment for  counseling. -Continue to monitor  Return if symptoms worsen or fail to improve.  Follow-up as needed in the next few months for chronic conditions and CPE.  Billie Ruddy, MD

## 2022-08-10 ENCOUNTER — Ambulatory Visit (INDEPENDENT_AMBULATORY_CARE_PROVIDER_SITE_OTHER): Payer: 59 | Admitting: Family Medicine

## 2022-08-10 ENCOUNTER — Encounter: Payer: Self-pay | Admitting: Family Medicine

## 2022-08-10 VITALS — BP 130/70 | HR 72 | Temp 99.0°F | Ht 64.57 in | Wt 217.8 lb

## 2022-08-10 DIAGNOSIS — E66812 Body mass index (BMI) 37.0-37.9, adult: Secondary | ICD-10-CM

## 2022-08-10 DIAGNOSIS — R253 Fasciculation: Secondary | ICD-10-CM

## 2022-08-10 DIAGNOSIS — E559 Vitamin D deficiency, unspecified: Secondary | ICD-10-CM | POA: Diagnosis not present

## 2022-08-10 DIAGNOSIS — K5900 Constipation, unspecified: Secondary | ICD-10-CM | POA: Diagnosis not present

## 2022-08-10 DIAGNOSIS — Z0000 Encounter for general adult medical examination without abnormal findings: Secondary | ICD-10-CM

## 2022-08-10 DIAGNOSIS — E669 Obesity, unspecified: Secondary | ICD-10-CM

## 2022-08-10 DIAGNOSIS — D509 Iron deficiency anemia, unspecified: Secondary | ICD-10-CM

## 2022-08-10 DIAGNOSIS — Z0001 Encounter for general adult medical examination with abnormal findings: Secondary | ICD-10-CM

## 2022-08-10 DIAGNOSIS — Z6837 Body mass index (BMI) 37.0-37.9, adult: Secondary | ICD-10-CM

## 2022-08-10 DIAGNOSIS — R7303 Prediabetes: Secondary | ICD-10-CM | POA: Diagnosis not present

## 2022-08-10 LAB — LIPID PANEL
Cholesterol: 181 mg/dL (ref 0–200)
HDL: 33.6 mg/dL — ABNORMAL LOW (ref >39.00)
LDL Cholesterol: 133 mg/dL — ABNORMAL HIGH (ref 0–99)
NonHDL: 147.67
Total CHOL/HDL Ratio: 5
Triglycerides: 74 mg/dL (ref 0.0–149.0)
VLDL: 14.8 mg/dL (ref 0.0–40.0)

## 2022-08-10 LAB — COMPREHENSIVE METABOLIC PANEL
ALT: 30 U/L (ref 0–35)
AST: 21 U/L (ref 0–37)
Albumin: 4.3 g/dL (ref 3.5–5.2)
Alkaline Phosphatase: 69 U/L (ref 39–117)
BUN: 8 mg/dL (ref 6–23)
CO2: 24 mEq/L (ref 19–32)
Calcium: 9.3 mg/dL (ref 8.4–10.5)
Chloride: 107 mEq/L (ref 96–112)
Creatinine, Ser: 0.77 mg/dL (ref 0.40–1.20)
GFR: 106.08 mL/min (ref >60.00)
Glucose, Bld: 80 mg/dL (ref 70–99)
Potassium: 3.7 mEq/L (ref 3.5–5.1)
Sodium: 141 mEq/L (ref 135–145)
Total Bilirubin: 0.7 mg/dL (ref 0.2–1.2)
Total Protein: 7.9 g/dL (ref 6.0–8.3)

## 2022-08-10 LAB — CBC WITH DIFFERENTIAL/PLATELET
Basophils Absolute: 0 10*3/uL (ref 0.0–0.1)
Basophils Relative: 0.5 % (ref 0.0–3.0)
Eosinophils Absolute: 0.1 10*3/uL (ref 0.0–0.7)
Eosinophils Relative: 0.9 % (ref 0.0–5.0)
HCT: 35.8 % — ABNORMAL LOW (ref 36.0–46.0)
Hemoglobin: 11.7 g/dL — ABNORMAL LOW (ref 12.0–15.0)
Lymphocytes Relative: 31.3 % (ref 12.0–46.0)
Lymphs Abs: 2.3 10*3/uL (ref 0.7–4.0)
MCHC: 32.7 g/dL (ref 30.0–36.0)
MCV: 73.9 fl — ABNORMAL LOW (ref 78.0–100.0)
Monocytes Absolute: 0.5 10*3/uL (ref 0.1–1.0)
Monocytes Relative: 6.2 % (ref 3.0–12.0)
Neutro Abs: 4.5 10*3/uL (ref 1.4–7.7)
Neutrophils Relative %: 61.1 % (ref 43.0–77.0)
Platelets: 371 10*3/uL (ref 150.0–400.0)
RBC: 4.84 Mil/uL (ref 3.87–5.11)
RDW: 17.4 % — ABNORMAL HIGH (ref 11.5–15.5)
WBC: 7.4 10*3/uL (ref 4.0–10.5)

## 2022-08-10 LAB — HEMOGLOBIN A1C: Hgb A1c MFr Bld: 5.8 % (ref 4.6–6.5)

## 2022-08-10 LAB — IBC + FERRITIN
Ferritin: 4.1 ng/mL — ABNORMAL LOW (ref 10.0–291.0)
Iron: 33 ug/dL — ABNORMAL LOW (ref 42–145)
Saturation Ratios: 6 % — ABNORMAL LOW (ref 20.0–50.0)
TIBC: 548.8 ug/dL — ABNORMAL HIGH (ref 250.0–450.0)
Transferrin: 392 mg/dL — ABNORMAL HIGH (ref 212.0–360.0)

## 2022-08-10 LAB — T4, FREE: Free T4: 1 ng/dL (ref 0.60–1.60)

## 2022-08-10 LAB — VITAMIN B12: Vitamin B-12: 434 pg/mL (ref 211–911)

## 2022-08-10 LAB — MAGNESIUM: Magnesium: 2.1 mg/dL (ref 1.5–2.5)

## 2022-08-10 LAB — VITAMIN D 25 HYDROXY (VIT D DEFICIENCY, FRACTURES): VITD: 25.94 ng/mL — ABNORMAL LOW (ref 30.00–100.00)

## 2022-08-10 LAB — TSH: TSH: 1.42 u[IU]/mL (ref 0.35–5.50)

## 2022-08-10 NOTE — Progress Notes (Signed)
Established Patient Office Visit   Subjective  Patient ID: Kathryn Brandt, female    DOB: 07-08-1995  Age: 27 y.o. MRN: [id number]  No chief complaint on file.   Patient is a 28 year old female seen for CPE.  Patient states she is doing better since last OFV.  Energy has increased.  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  Notes some improvement in balance since starting probiotic 1 week ago.  Trying to increase water intake and eating more vegetables.  Wants to see if things improve further in the next month before making other decisions regarding chronic constipation.  Has not had any headaches since last office visit.  Notes muscle twinge/flutter in stomach, legs with occasional paresthesias.  Notes the paresthesias if son is laying on her arm or sitting on toilet for extended periods of time.    Patient Active Problem List   Diagnosis Date Noted   Iron deficiency anemia 05/16/2021   Prediabetes 05/16/2021   Iron deficiency 04/22/2020   Rash of finger 01/08/2020   HLD (hyperlipidemia) 04/10/2019   Vitamin D deficiency 04/10/2019   Annual physical exam 04/07/2019   Normal postpartum course 02/15/2019   Postpartum anemia 02/15/2019   Normal labor 02/13/2019   Gestational hypertension 02/13/2019   Postpartum care following vaginal delivery 02/13/2019   Past Surgical History:  Procedure Laterality Date   WISDOM TOOTH EXTRACTION     Social History   Tobacco Use   Smoking status: Never   Smokeless tobacco: Never  Substance Use Topics   Alcohol use: Yes    Comment: occas.   Drug use: Never   Family History  Problem Relation Age of Onset   Diabetes Father    Diabetes Maternal Grandfather    Diabetes Paternal Grandmother    Iron deficiency Mother    Diabetes Maternal Uncle    Diabetes Maternal Grandmother    Diabetes Other        GGM   Colon cancer Other        m. great uncle    No Known Allergies     ROS Negative unless stated above    Objective:     BP 130/70 (BP Location: Left Arm, Patient Position: Sitting, Cuff Size: Large)   Pulse 72   Temp 99 F (37.2 C) (Oral)   Ht 5' 4.57" (1.64 m)   Wt 217 lb 12.8 oz (98.8 kg)   SpO2 98%   BMI 36.73 kg/m  BP Readings from Last 3 Encounters:  08/10/22 130/70  07/07/22 110/76  08/09/21 120/80   Wt Readings from Last 3 Encounters:  08/10/22 217 lb 12.8 oz (98.8 kg)  07/07/22 216 lb 6.4 oz (98.2 kg)  08/09/21 202 lb 12.8 oz (92 kg)      Physical Exam Constitutional:      Appearance: Normal appearance.  HENT:     Head: Normocephalic and atraumatic.     Right Ear: Tympanic membrane, ear canal and external ear normal.     Left Ear: Tympanic membrane, ear canal and external ear normal.     Nose: Nose normal.     Mouth/Throat:     Mouth: Mucous membranes are moist.     Pharynx: No oropharyngeal exudate or posterior oropharyngeal erythema.  Eyes:     General: No scleral icterus.    Extraocular Movements: Extraocular movements intact.     Conjunctiva/sclera: Conjunctivae normal.     Pupils: Pupils are equal, round,  and reactive to light.  Neck:     Thyroid: No thyromegaly.  Cardiovascular:     Rate and Rhythm: Normal rate and regular rhythm.     Pulses: Normal pulses.     Heart sounds: Normal heart sounds. No murmur heard.    No friction rub.  Pulmonary:     Effort: Pulmonary effort is normal.     Breath sounds: Normal breath sounds. No wheezing, rhonchi or rales.  Abdominal:     General: Bowel sounds are normal.     Palpations: Abdomen is soft.     Tenderness: There is no abdominal tenderness.  Musculoskeletal:        General: No deformity. Normal range of motion.  Lymphadenopathy:     Cervical: No cervical adenopathy.  Skin:    General: Skin is warm and dry.     Findings: No lesion.  Neurological:     General: No focal deficit present.     Mental Status: She is alert and oriented to person, place, and time.   Psychiatric:        Mood and Affect: Mood normal.        Thought Content: Thought content normal.      No results found for any visits on 08/10/22.    Assessment & Plan:  Well adult exam -Anticipatory guidance given including wearing seatbelts, smoke detectors in the home, increasing physical activity, increasing p.o. intake of water and vegetables. -labs -Age-appropriate screenings discussed. -Pap done 03/24/2022 -Immunizations reviewed -Next CPE in 1 year -     Lipid panel -     Hemoglobin A1c  Fasciculation -     Comprehensive metabolic panel -     Vitamin B12 -     Magnesium  Vitamin D deficiency -History of vitamin D deficiency -     VITAMIN D 25 Hydroxy (Vit-D Deficiency, Fractures)  Iron deficiency anemia, unspecified iron deficiency anemia type -Hemoglobin 9.5 on 05/16/2021 -     CBC with Differential/Platelet -     IBC + Ferritin  Constipation, unspecified constipation type -Continue probiotic and other lifestyle modifications -     CBC with Differential/Platelet -     Comprehensive metabolic panel -     TSH -     T4, free -     Magnesium  Prediabetes -     Lipid panel -     Hemoglobin A1c  Class II obesity without serious comorbidity with body mass index (BMI 37-37.9, unspecified obesity type -Body mass index is 36.73 kg/m. -Congratulated on recent diet changes -Continue lifestyle modifications  No follow-ups on file.   Kathryn Saint, MD

## 2022-08-16 ENCOUNTER — Other Ambulatory Visit: Payer: Self-pay | Admitting: Family Medicine

## 2022-08-16 DIAGNOSIS — E559 Vitamin D deficiency, unspecified: Secondary | ICD-10-CM

## 2022-08-16 MED ORDER — VITAMIN D (ERGOCALCIFEROL) 1.25 MG (50000 UNIT) PO CAPS
50000.0000 [IU] | ORAL_CAPSULE | ORAL | 0 refills | Status: AC
Start: 2022-08-16 — End: ?

## 2022-11-15 ENCOUNTER — Telehealth: Payer: Self-pay | Admitting: Family Medicine

## 2022-11-15 ENCOUNTER — Other Ambulatory Visit: Payer: 59

## 2022-11-15 ENCOUNTER — Ambulatory Visit: Payer: 59

## 2022-11-15 DIAGNOSIS — Z111 Encounter for screening for respiratory tuberculosis: Secondary | ICD-10-CM

## 2022-11-15 NOTE — Addendum Note (Signed)
Addended by: Marian Sorrow D on: 11/15/2022 03:52 PM   Modules accepted: Orders

## 2022-11-15 NOTE — Progress Notes (Deleted)
PPD Placement note Kathryn Brandt, 27 y.o. female is here today for placement of PPD test Reason for PPD test: employee  Pt taken PPD test before: yes Verified in allergy area and with patient that they are not allergic to the products PPD is made of (Phenol or Tween). Yes Is patient taking any oral or IV steroid medication now or have they taken it in the last month? no Has the patient ever received the BCG vaccine?: no Has the patient been in recent contact with anyone known or suspected of having active TB disease?: no      Date of exposure (if applicable): N/A      Name of person they were exposed to (if applicable): N/A Patient's Country of origin?: [place] O: Alert and oriented in NAD. P:  PPD placed on 11/15/2022.  Patient advised to return for reading within 48-72 hours.

## 2022-11-15 NOTE — Telephone Encounter (Signed)
Patient dropped off document  School Physical Form , to be filled out by provider. Patient requested to send it back via Fax within 5-days. Document is located in providers tray at front office.Please advise at Mobile [phone] (mobile)

## 2022-11-15 NOTE — Addendum Note (Signed)
Addended by: Marian Sorrow D on: 11/15/2022 03:11 PM   Modules accepted: Orders

## 2022-11-17 LAB — QUANTIFERON-TB GOLD PLUS
Mitogen-NIL: >10 IU/mL
NIL: 0.04 IU/mL
QuantiFERON-TB Gold Plus: POSITIVE — AB
TB1-NIL: 0.45 IU/mL
TB2-NIL: 0.69 IU/mL

## 2022-11-20 ENCOUNTER — Telehealth: Payer: Self-pay | Admitting: Family Medicine

## 2022-11-20 NOTE — Telephone Encounter (Signed)
Called pt, left VM to call office back

## 2022-11-20 NOTE — Telephone Encounter (Signed)
Called pt, left VM to  call office back. Will fax results to [facility] dept of health and human services, they will contact her.

## 2022-11-20 NOTE — Telephone Encounter (Signed)
Relayed previous message to patient regarding [facility]. Patient confirmed understanding

## 2022-11-20 NOTE — Telephone Encounter (Addendum)
Pt is calling and has seen her tb blood work and would like to know the next step she tested positive.Please advise

## 2022-11-28 NOTE — Telephone Encounter (Signed)
Pt calling states the [facility] department does not have her results. Does not have a fax number to confirm they were sent to the correct place. Asking if this could possibly be a false positive, request a call

## 2022-12-06 ENCOUNTER — Ambulatory Visit
Admission: RE | Admit: 2022-12-06 | Discharge: 2022-12-06 | Disposition: A | Discharge status: Home / Self Care | Payer: No Typology Code available for payment source | Source: Ambulatory Visit | Attending: Obstetrics and Gynecology | Admitting: Obstetrics and Gynecology

## 2022-12-06 ENCOUNTER — Other Ambulatory Visit: Payer: Self-pay | Admitting: Obstetrics and Gynecology

## 2022-12-06 DIAGNOSIS — R7611 Nonspecific reaction to tuberculin skin test without active tuberculosis: Secondary | ICD-10-CM

## 2022-12-08 NOTE — Telephone Encounter (Signed)
Called [facility] dept, the person for TB, is out of the office until 12/11/22 will call back to see if she received form.

## 2022-12-11 NOTE — Telephone Encounter (Signed)
Called Tammy at [facility] department of public health but no answer. Will call Tuberculosis (TB): [phone] later.

## 2023-01-29 ENCOUNTER — Telehealth: Payer: Self-pay | Admitting: Family Medicine

## 2023-01-29 NOTE — Telephone Encounter (Signed)
Pt states she dropped off a physical form about mid-August and had requested to have form faxed to [facility] once it was completed. She wanted to check and see if that form was faxed. She states the best fax number to send form to is [phone]. She is requesting a call back for an update.

## 2023-01-29 NOTE — Telephone Encounter (Signed)
Patient dropped off document  Health Examination Certification , to be filled out by provider. Patient requested to send it back via Fax within 7-days. Document is located in providers tray at front office.Please advise at  fax # [phone]

## 2023-02-05 ENCOUNTER — Telehealth: Payer: Self-pay

## 2023-02-05 ENCOUNTER — Encounter: Payer: Self-pay | Admitting: Family Medicine

## 2023-02-06 NOTE — Telephone Encounter (Signed)
Error

## 2023-02-14 NOTE — Telephone Encounter (Signed)
Spoke with patient.

## 2023-02-14 NOTE — Telephone Encounter (Signed)
Pt called, yelling and screaming into the phone, saying she has been waiting 3 wks for paperwork to be faxed and because of [place], she is not getting paid!!  Pt screamed into the phone that we need to get this done today, and if we don't, she will come here and it will not be pretty!!

## 2023-02-14 NOTE — Telephone Encounter (Signed)
Spoke with patient health examination has been faxed on 02/08/2023, patient will call to [facility]

## 2023-05-14 ENCOUNTER — Ambulatory Visit: Payer: 59 | Admitting: Family Medicine

## 2023-05-14 ENCOUNTER — Encounter: Payer: Self-pay | Admitting: Family Medicine

## 2023-05-14 VITALS — BP 122/80 | HR 92 | Temp 98.4°F | Ht 64.5 in | Wt 214.2 lb

## 2023-05-14 DIAGNOSIS — E66812 Obesity, class 2: Secondary | ICD-10-CM

## 2023-05-14 DIAGNOSIS — D509 Iron deficiency anemia, unspecified: Secondary | ICD-10-CM

## 2023-05-14 DIAGNOSIS — R7303 Prediabetes: Secondary | ICD-10-CM | POA: Diagnosis not present

## 2023-05-14 DIAGNOSIS — E559 Vitamin D deficiency, unspecified: Secondary | ICD-10-CM | POA: Diagnosis not present

## 2023-05-14 DIAGNOSIS — Z0000 Encounter for general adult medical examination without abnormal findings: Secondary | ICD-10-CM

## 2023-05-14 DIAGNOSIS — Z6837 Body mass index (BMI) 37.0-37.9, adult: Secondary | ICD-10-CM

## 2023-05-14 NOTE — Progress Notes (Signed)
 Established Patient Office Visit   Subjective  Patient ID: Kathryn Brandt, female    DOB: [birth year]  Age: 28 y.o. MRN: [id number]  Chief Complaint  Patient presents with   Annual Exam  Pt accompanied by her young son.  Patient is a 28 year old female seen for CPE.  Last CPE 08/10/2022.  Patient states she is doing well overall and is without complaint.    Patient Active Problem List   Diagnosis Date Noted   Iron deficiency anemia 05/16/2021   Prediabetes 05/16/2021   Iron deficiency 04/22/2020   Rash of finger 01/08/2020   HLD (hyperlipidemia) 04/10/2019   Vitamin D  deficiency 04/10/2019   Annual physical exam 04/07/2019   Normal postpartum course 02/15/2019   Postpartum anemia 02/15/2019   Normal labor 02/13/2019   Gestational hypertension 02/13/2019   Postpartum care following vaginal delivery 02/13/2019   Past Medical History:  Diagnosis Date   Iron deficiency    in pregnancy    Past Surgical History:  Procedure Laterality Date   WISDOM TOOTH EXTRACTION     Social History   Tobacco Use   Smoking status: Never   Smokeless tobacco: Never  Substance Use Topics   Alcohol use: Yes    Comment: occas.   Drug use: Never   Family History  Problem Relation Age of Onset   Diabetes Father    Diabetes Maternal Grandfather    Diabetes Paternal Grandmother    Iron deficiency Mother    Diabetes Maternal Uncle    Diabetes Maternal Grandmother    Diabetes Other        GGM   Colon cancer Other        m. great uncle    No Known Allergies    ROS Negative unless stated above    Objective:     BP 122/80 (BP Location: Left Arm, Patient Position: Sitting, Cuff Size: Large)   Pulse 92   Temp 98.4 F (36.9 C) (Oral)   Ht 5' 4.5 (1.638 m)   Wt 214 lb 3.2 oz (97.2 kg)   LMP 05/09/2023   SpO2 98%   BMI 36.20 kg/m  BP Readings from Last 3 Encounters:  05/14/23 122/80  08/10/22 130/70  07/07/22 110/76   Wt Readings from Last 3 Encounters:  05/14/23  214 lb 3.2 oz (97.2 kg)  08/10/22 217 lb 12.8 oz (98.8 kg)  07/07/22 216 lb 6.4 oz (98.2 kg)      Physical Exam Constitutional:      Appearance: Normal appearance.  HENT:     Head: Normocephalic and atraumatic.     Right Ear: Tympanic membrane, ear canal and external ear normal.     Left Ear: Tympanic membrane, ear canal and external ear normal.     Nose: Nose normal.     Mouth/Throat:     Mouth: Mucous membranes are moist.     Pharynx: No oropharyngeal exudate or posterior oropharyngeal erythema.  Eyes:     General: No scleral icterus.    Extraocular Movements: Extraocular movements intact.     Conjunctiva/sclera: Conjunctivae normal.     Pupils: Pupils are equal, round, and reactive to light.  Neck:     Thyroid: No thyromegaly.  Cardiovascular:     Rate and Rhythm: Normal rate and regular rhythm.     Pulses: Normal pulses.     Heart sounds: Normal heart sounds. No murmur heard.    No friction rub.  Pulmonary:     Effort: Pulmonary effort is normal.  Breath sounds: Normal breath sounds. No wheezing, rhonchi or rales.  Abdominal:     General: Bowel sounds are normal.     Palpations: Abdomen is soft.     Tenderness: There is no abdominal tenderness.  Musculoskeletal:        General: No deformity. Normal range of motion.  Lymphadenopathy:     Cervical: No cervical adenopathy.  Skin:    General: Skin is warm and dry.     Findings: No lesion.  Neurological:     General: No focal deficit present.     Mental Status: She is alert and oriented to person, place, and time.  Psychiatric:        Mood and Affect: Mood normal.        Thought Content: Thought content normal.     No results found for any visits on 05/14/23.    Assessment & Plan:  Well adult exam -Age-appropriate health screenings discussed -Orders for labs placed.  Patient will return when fasting. -Immunizations reviewed.  Consider influenza vaccine -Pap up-to-date.  Done 03/24/2022. -Next CPE in 1  year -     Comprehensive metabolic panel; Future -     Lipid panel; Future  Prediabetes -Hemoglobin A1c 5.8% on 08/10/2022 -Continue lifestyle modifications -     Hemoglobin A1c; Future  Vitamin D  deficiency -Vitamin D  25.94 On 08/10/2022 -     VITAMIN D  25 Hydroxy (Vit-D Deficiency, Fractures); Future  Iron deficiency anemia, unspecified iron deficiency anemia type -H/H 11.7/35.8 on 08/10/2022 with iron 33, TIBC 548.8, ferritin 4.1, transferrin 392 -Continue eating iron rich foods. -     CBC with Differential/Platelet; Future -     Iron, TIBC and Ferritin Panel; Future  Class 2 obesity without serious comorbidity with body mass index (BMI) of 37.0 to 37.9 in adult, unspecified obesity type -Body mass index is 36.2 kg/m. -Continue lifestyle modifications -     CBC with Differential/Platelet; Future -     Comprehensive metabolic panel; Future -     TSH; Future -     VITAMIN D  25 Hydroxy (Vit-D Deficiency, Fractures); Future   Return if symptoms worsen or fail to improve.   Clotilda JONELLE Single, MD

## 2023-06-07 ENCOUNTER — Encounter: Payer: Self-pay | Admitting: Internal Medicine

## 2023-06-07 ENCOUNTER — Ambulatory Visit: Payer: Self-pay | Admitting: Family Medicine

## 2023-06-07 ENCOUNTER — Ambulatory Visit: Payer: 59 | Admitting: Internal Medicine

## 2023-06-07 VITALS — BP 112/70 | HR 73 | Temp 98.3°F | Wt 214.0 lb

## 2023-06-07 DIAGNOSIS — G43009 Migraine without aura, not intractable, without status migrainosus: Secondary | ICD-10-CM | POA: Diagnosis not present

## 2023-06-07 NOTE — Patient Instructions (Signed)
 Please try 2 aleve at the onset of a headache---or excedrin migraine. If those don't work, we should try the maxalt  again--but 10mg  (5mg  may not be enough)
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
## 2023-06-07 NOTE — Assessment & Plan Note (Signed)
 Usually responds to sleep Reluctant about the maxalt ---reassured fairly safe  Try 2 aleve at headache onset Could also use exedrin migraine If those aren't effective, would try the maxalt  again--but would try 10mg 

## 2023-06-07 NOTE — Telephone Encounter (Signed)
 Patient has an appt with Dr. Joelle Musca 06/07/23

## 2023-06-07 NOTE — Progress Notes (Signed)
 Subjective:    Patient ID: Kathryn Brandt, female    DOB: 06/19/1995, 28 y.o.   MRN: [id number]  HPI Here due to headache  Has been having intermittent headaches Usually if she gets one--she can take a nap and it goes away Last one yesterday morning--started on way to work--but worsened Finally easing up on the way here  Sharp pain in mid forehead now---had been right frontal (but in different places)  Goes back last few years No clear trigger Wonders if it is food related---?with junk food Not at end of day Variable locations Sharp and throbbing Worse with movement Some worsening with light and noise No nausea  Not that often--these are first in the past month No apparent aura  Got maxalt  --tried it a couple of times--then was concerned about side effects  Doesn't really drink caffeine  Does take tylenol  Advil  in the past--but hasn't used it since pregnancy  Current Outpatient Medications on File Prior to Visit  Medication Sig Dispense Refill   acetaminophen  (TYLENOL ) 500 MG tablet Take 500 mg by mouth every 6 (six) hours as needed for mild pain or headache.     Vitamin D , Ergocalciferol , (DRISDOL ) 1.25 MG (50000 UNIT) CAPS capsule Take 1 capsule (50,000 Units total) by mouth every 7 (seven) days. (Patient not taking: Reported on 06/07/2023) 12 capsule 0   No current facility-administered medications on file prior to visit.    No Known Allergies  Past Medical History:  Diagnosis Date   Iron deficiency    in pregnancy     Past Surgical History:  Procedure Laterality Date   WISDOM TOOTH EXTRACTION      Family History  Problem Relation Age of Onset   Diabetes Father    Diabetes Maternal Grandfather    Diabetes Paternal Grandmother    Iron deficiency Mother    Diabetes Maternal Uncle    Diabetes Maternal Grandmother    Diabetes Other        GGM   Colon cancer Other        m. great uncle     Social History   Socioeconomic History   Marital  status: Single    Spouse name: Not on file   Number of children: Not on file   Years of education: Not on file   Highest education level: Not on file  Occupational History   Not on file  Tobacco Use   Smoking status: Never   Smokeless tobacco: Never  Substance and Sexual Activity   Alcohol use: Yes    Comment: occas.   Drug use: Never   Sexual activity: Yes    Partners: Male  Other Topics Concern   Not on file  Social History Narrative   Mom Charnetta Wulff DPR (also Dr. VALERO ENERGY patient)   We can leave messages to patients phone if we call and she does not answer. 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  Neurological:     Mental Status: She is alert.  Psychiatric:        Mood and Affect: Mood normal.        Behavior: Behavior normal.            Assessment & Plan:

## 2023-07-06 IMAGING — DX DG KNEE COMPLETE 4+V*L*
5 series · 5 of 5 positions shown · non-contrast
Comparison: None.

CLINICAL DATA: Left knee pain

EXAM:
LEFT KNEE - COMPLETE 4+ VIEW

[knee standing ap]
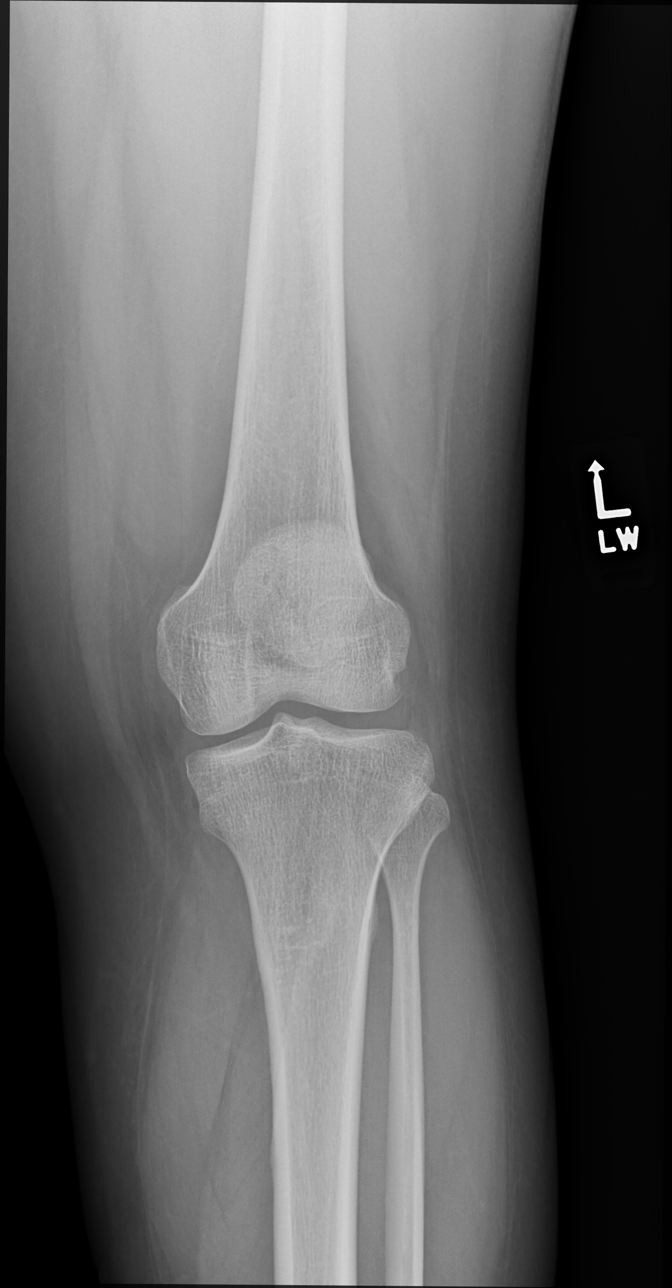

[knee standing external ap]
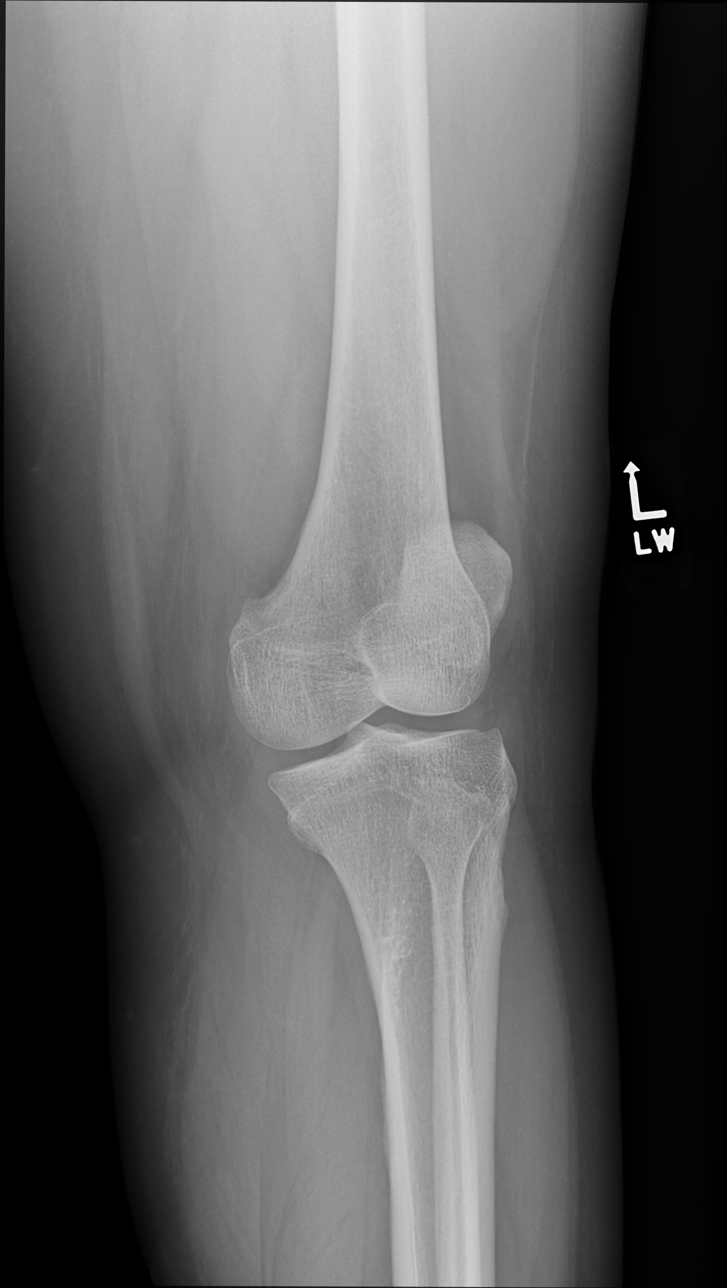

[knee standing internal ap]
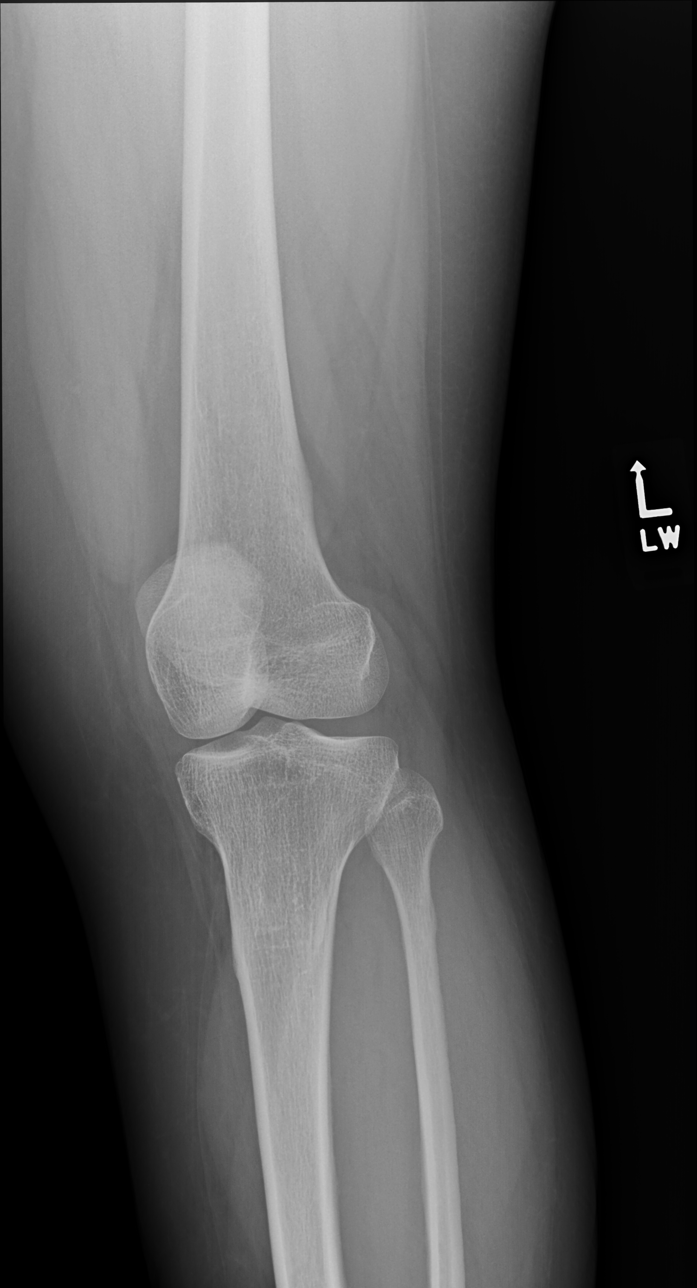

[knee standing lat]
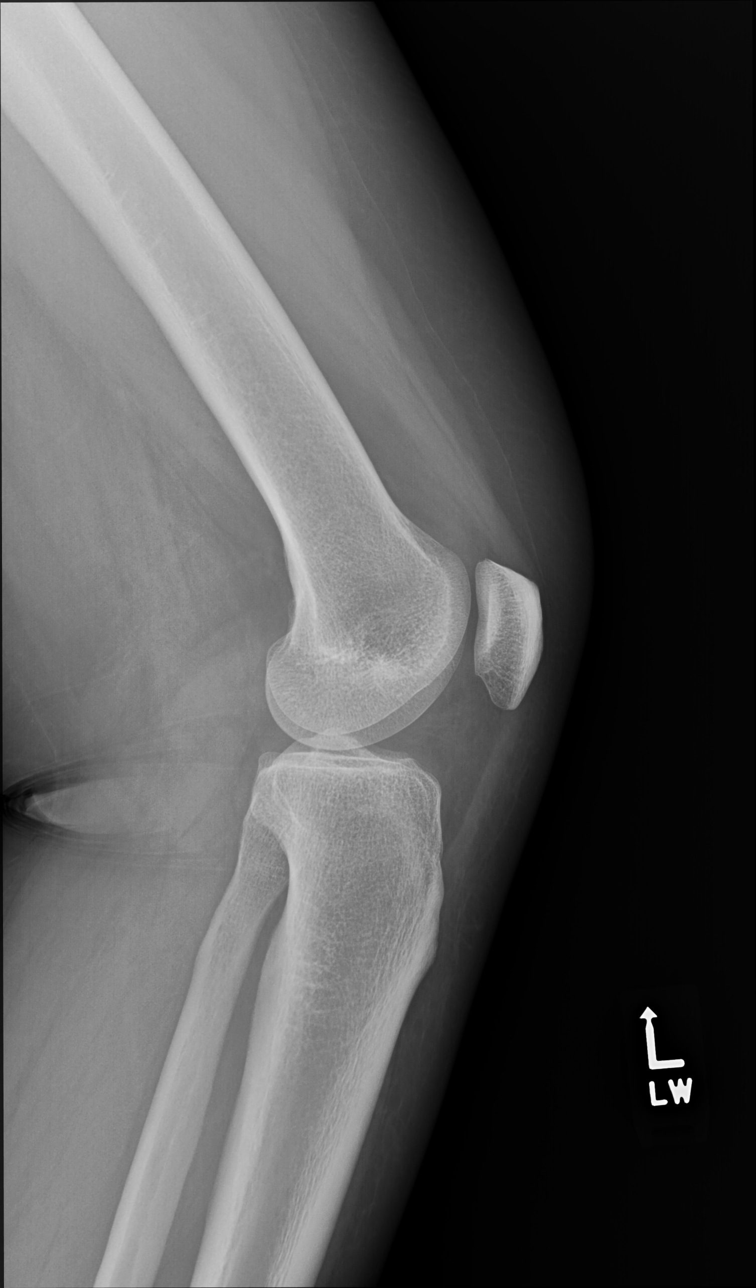

[knee [person_name] view pa]
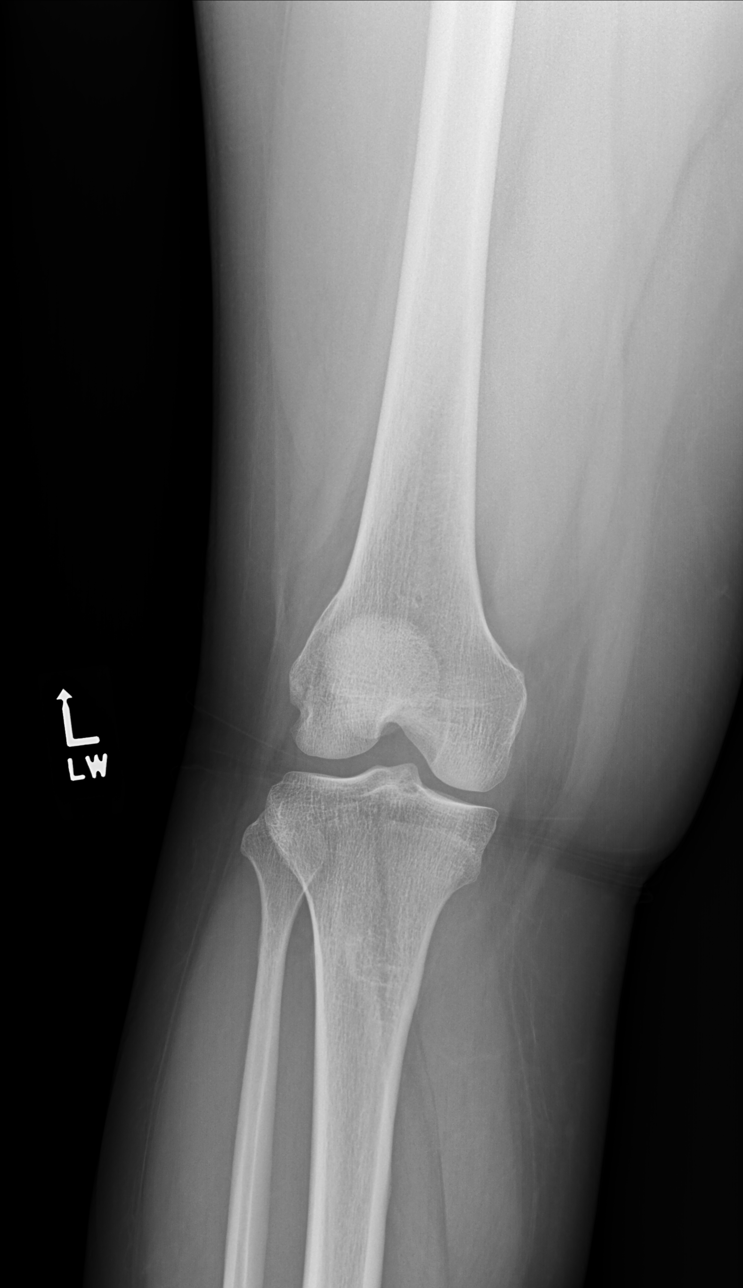

[5 of 5 positions shown; findings below may reference images not displayed]

FINDINGS: No evidence of fracture, dislocation, or joint effusion. No evidence
of arthropathy or other focal bone abnormality. Soft tissues are
unremarkable.
IMPRESSION: Negative.
